# Patient Record
Sex: Female | Born: 1995 | Race: Black or African American | Hispanic: No | Marital: Single | Smoking: Former smoker
Health system: Southern US, Community
[De-identification: ages and names within clinical notes are randomized; demographics above are authoritative.]

## PROBLEM LIST (undated history)

## (undated) DIAGNOSIS — F419 Anxiety disorder, unspecified: Secondary | ICD-10-CM

## (undated) DIAGNOSIS — F32A Depression, unspecified: Secondary | ICD-10-CM

## (undated) HISTORY — PX: EYE SURGERY: SHX253

## (undated) HISTORY — DX: Anxiety disorder, unspecified: F41.9

## (undated) HISTORY — DX: Depression, unspecified: F32.A

---
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
## 2016-09-30 DIAGNOSIS — F410 Panic disorder [episodic paroxysmal anxiety] without agoraphobia: Secondary | ICD-10-CM | POA: Insufficient documentation

## 2019-05-24 ENCOUNTER — Encounter (HOSPITAL_COMMUNITY): Payer: Self-pay | Admitting: Emergency Medicine

## 2019-05-24 ENCOUNTER — Other Ambulatory Visit: Payer: Self-pay

## 2019-05-24 ENCOUNTER — Emergency Department (HOSPITAL_COMMUNITY)
Admission: EM | Admit: 2019-05-24 | Discharge: 2019-05-24 | Disposition: A | Discharge status: Home / Self Care | Payer: BC Managed Care – PPO | Attending: Emergency Medicine | Admitting: Emergency Medicine

## 2019-05-24 DIAGNOSIS — J020 Streptococcal pharyngitis: Secondary | ICD-10-CM | POA: Diagnosis not present

## 2019-05-24 DIAGNOSIS — Z20828 Contact with and (suspected) exposure to other viral communicable diseases: Secondary | ICD-10-CM | POA: Insufficient documentation

## 2019-05-24 DIAGNOSIS — J029 Acute pharyngitis, unspecified: Secondary | ICD-10-CM | POA: Diagnosis present

## 2019-05-24 LAB — SARS CORONAVIRUS 2 (TAT 6-24 HRS): SARS Coronavirus 2: NEGATIVE

## 2019-05-24 LAB — GROUP A STREP BY PCR: Group A Strep by PCR: DETECTED — AB

## 2019-05-24 MED ORDER — ACETAMINOPHEN 160 MG/5ML PO SOLN
650.0000 mg | Freq: Once | ORAL | Status: AC
  Administered 2019-05-24: 650 mg via ORAL
  Filled 2019-05-24: qty 20.3

## 2019-05-24 MED ORDER — DEXAMETHASONE SODIUM PHOSPHATE 10 MG/ML IJ SOLN
10.0000 mg | Freq: Once | INTRAMUSCULAR | Status: AC
  Administered 2019-05-24: 12:00:00 10 mg via INTRAMUSCULAR
  Filled 2019-05-24: qty 1

## 2019-05-24 MED ORDER — PENICILLIN G BENZATHINE 1200000 UNIT/2ML IM SUSP
1.2000 10*6.[IU] | Freq: Once | INTRAMUSCULAR | Status: AC
  Administered 2019-05-24: 12:00:00 1.2 10*6.[IU] via INTRAMUSCULAR
  Filled 2019-05-24: qty 2

## 2019-05-24 NOTE — ED Triage Notes (Signed)
Sore throat, dry cough, and body aches onset yesterday; denies COVID exposures.

## 2019-05-24 NOTE — Discharge Instructions (Signed)

## 2019-05-24 NOTE — ED Provider Notes (Signed)
[facility]   CSN: [id number] Arrival date & time: 05/24/19  [id number]     History   Chief Complaint Chief Complaint  Patient presents with  . Sore Throat    HPI Christy Maldonado is a 23 y.o. female otherwise healthy presents emergency department today with chief complaint of sore throat x2 days.  She is also reporting associated dry cough and generalized bodyaches.  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  She has not worked closely with this resident lately.  Took Alka-Seltzer yesterday with symptom improvement. No modifying or radiating factors. She denies fever, chills, congestion, trouble swallowing, voice changes,chest pain, shortness of breath, abdominal pain, nausea, vomiting, urinary symptoms, diarrhea. History provided by patient.    History reviewed. No pertinent past medical history.  There are no active problems to display for this patient.   History reviewed. No pertinent surgical history.   OB History   No obstetric history on file.      Home Medications    Prior to Admission medications   Not on File    Family History No family history on file.  Social History Social History   Tobacco Use  . Smoking status: Not on file  Substance Use Topics  . Alcohol use: Not on file  . Drug use: Not on file     Allergies   Patient has no known allergies.   Review of Systems Review of Systems  Constitutional: Negative for chills and fever.  HENT: Positive for sore throat. Negative for congestion, ear discharge, ear pain, rhinorrhea, sinus pressure, sinus pain, trouble swallowing and voice change.   Eyes: Negative for pain and redness.  Respiratory: Positive for cough. Negative for shortness of breath.   Cardiovascular: Negative for chest pain.  Gastrointestinal: Negative for abdominal pain, diarrhea, nausea and vomiting.   Musculoskeletal: Negative for back pain and neck pain.  Skin: Negative for rash and wound.     Physical Exam Updated Vital Signs BP 129/62 (BP Location: Right Arm)   Pulse (!) 102   Temp 98.8 F (37.1 C) (Oral)   Resp 16   SpO2 100%   Physical Exam Vitals signs and nursing note reviewed.  Constitutional:      Appearance: She is well-developed. She is not ill-appearing or toxic-appearing.  HENT:     Head: Normocephalic and atraumatic.     Comments: No sinus or temporal tenderness.     Right Ear: Tympanic membrane and external ear normal.     Left Ear: Tympanic membrane and external ear normal.     Nose: Nose normal.     Mouth/Throat:     Mouth: Mucous membranes are dry.     Comments: Minor erythema to oropharynx, no edema. White  Exudate on left tonsil, no tonsillar swelling, voice normal, neck supple without lymphadenopathy  Eyes:     General: No scleral icterus.       Right eye: No discharge.        Left eye: No discharge.     Conjunctiva/sclera: Conjunctivae normal.  Neck:     Musculoskeletal: Normal range of motion. No neck rigidity or muscular tenderness.     Vascular: No JVD.  Cardiovascular:     Rate and Rhythm: Normal rate and regular rhythm.     Pulses: Normal pulses.     Heart sounds: Normal heart sounds.  Pulmonary:     Effort: Pulmonary  effort is normal.     Breath sounds: Normal breath sounds.  Abdominal:     General: There is no distension.  Musculoskeletal: Normal range of motion.  Lymphadenopathy:     Cervical: No cervical adenopathy.  Skin:    General: Skin is warm and dry.  Neurological:     Mental Status: She is oriented to person, place, and time.     GCS: GCS eye subscore is 4. GCS verbal subscore is 5. GCS motor subscore is 6.     Comments: Fluent speech, no facial droop.  Psychiatric:        Behavior: Behavior normal.      ED Treatments / Results  Labs (all labs ordered are listed, but only abnormal results are displayed) Labs  Reviewed  GROUP A STREP BY PCR - Abnormal; Notable for the following components:      Result Value   Group A Strep by PCR DETECTED (*)    All other components within normal limits  SARS CORONAVIRUS 2    EKG None  Radiology No results found.  Procedures Procedures (including critical care time)  Medications Ordered in ED Medications  penicillin g benzathine (BICILLIN LA) 1200000 UNIT/2ML injection 1.2 Million Units (has no administration in time range)  dexamethasone (DECADRON) injection 10 mg (has no administration in time range)  acetaminophen (TYLENOL) solution 650 mg (650 mg Oral Given 05/24/19 1132)     Initial Impression / Assessment and Plan / ED Course  I have reviewed the triage vital signs and the nursing notes.  Pertinent labs & imaging results that were available during my care of the patient were reviewed by me and considered in my medical decision making (see chart for details).   23 yo female who presents with sore throat. Still able to tolerate PO/secretions but with worsening pain. Patient is afebrile, non-toxic appearing, sitting comfortably on examination table. Vital signs reviewed and stable Tachycardic to 102 on arrival. During exam I checked pt's vitals and HR is 85. On exam, she has mild posterior oropharynx with tonsillar exudate on right tonsil. Presentation not concerning for PTA or Ludwig's angina, Uvulitis, epiglottitis, peritonsillar abscess, or retropharyngeal abscess.  Strep test is positive. Pt treated with IM penicillin, steroids while in the ED. Pt does not appear dehydrated, but did discuss importance of water rehydration.  Encouraged at home supportive care measures. Patient successfully fluid challenged in the ED without difficulty swallowing.  Strict return precautions given. NAD. VSS. Recommended PCP follow up for re-evaluation. Send out covid test performed, pt aware to self quarantine until she has result.    Christy Maldonado was evaluated in  Emergency Department on 05/24/2019 for the symptoms described in the history of present illness. She was evaluated in the context of the global COVID-19 pandemic, which necessitated consideration that the patient might be at risk for infection with the SARS-CoV-2 virus that causes COVID-19. Institutional protocols and algorithms that pertain to the evaluation of patients at risk for COVID-19 are in a state of rapid change based on information released by regulatory bodies including the CDC and federal and state organizations. These policies and algorithms were followed during the patient's care in the ED.  This note was prepared using Dragon voice recognition software and may include unintentional dictation errors due to the inherent limitations of voice recognition software.    Final Clinical Impressions(s) / ED Diagnoses   Final diagnoses:  Strep pharyngitis    ED Discharge Orders    None  Flint Melter 05/24/19 1156    Virgel Manifold, MD 05/24/19 1339

## 2019-09-27 ENCOUNTER — Other Ambulatory Visit: Payer: Self-pay

## 2019-09-27 ENCOUNTER — Encounter (HOSPITAL_COMMUNITY): Payer: Self-pay | Admitting: Emergency Medicine

## 2019-09-27 ENCOUNTER — Emergency Department (HOSPITAL_COMMUNITY)
Admission: EM | Admit: 2019-09-27 | Discharge: 2019-09-27 | Disposition: A | Discharge status: Home / Self Care | Payer: BC Managed Care – PPO | Attending: Emergency Medicine | Admitting: Emergency Medicine

## 2019-09-27 DIAGNOSIS — Z20828 Contact with and (suspected) exposure to other viral communicable diseases: Secondary | ICD-10-CM | POA: Insufficient documentation

## 2019-09-27 DIAGNOSIS — M791 Myalgia, unspecified site: Secondary | ICD-10-CM | POA: Diagnosis present

## 2019-09-27 DIAGNOSIS — B349 Viral infection, unspecified: Secondary | ICD-10-CM | POA: Diagnosis not present

## 2019-09-27 LAB — SARS CORONAVIRUS 2 (TAT 6-24 HRS): SARS Coronavirus 2: NEGATIVE

## 2019-09-27 NOTE — ED Provider Notes (Signed)
  [facility] DEPT Provider Note   CSN: [id number] Arrival date & time: 09/27/19  1147     History Chief Complaint  Patient presents with  . Generalized Body Aches    Christy Maldonado is a 23 y.o. female.  Pt complains of body aches and feeling like she has a fever.  Pt does not think she has had a covid exposure.  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  Pt reports she was tested last week and was negative for covid  The history is provided by the patient. No language interpreter was used.       History reviewed. No pertinent past medical history.  There are no problems to display for this patient.   History reviewed. No pertinent surgical history.   OB History   No obstetric history on file.     No family history on file.  Social History   Tobacco Use  . Smoking status: Not on file  Substance Use Topics  . Alcohol use: Not on file  . Drug use: Not on file    Home Medications Prior to Admission medications   Not on File    Allergies    Patient has no known allergies.  Review of Systems   Review of Systems  All other systems reviewed and are negative.   Physical Exam Updated Vital Signs BP 136/83   Pulse 87   Temp 98.5 F (36.9 C) (Oral)   Resp 18   LMP 09/24/2019   SpO2 100%   Physical Exam Vitals and nursing note reviewed.  Constitutional:      Appearance: She is well-developed.  HENT:     Head: Normocephalic.     Nose: Nose normal.     Mouth/Throat:     Mouth: Mucous membranes are moist.  Cardiovascular:     Rate and Rhythm: Normal rate.  Pulmonary:     Effort: Pulmonary effort is normal.  Abdominal:     General: There is no distension.  Musculoskeletal:        General: Normal range of motion.     Cervical back: Normal range of motion.  Skin:    General: Skin is warm.  Neurological:     Mental Status: She is alert and oriented to person, place, and time.     ED Results / Procedures / Treatments     Labs (all labs ordered are listed, but only abnormal results are displayed) Labs Reviewed  SARS CORONAVIRUS 2 (TAT 6-24 HRS)    EKG None  Radiology No results found.  Procedures Procedures (including critical care time)  Medications Ordered in ED Medications - No data to display  ED Course  I have reviewed the triage vital signs and the nursing notes.  Pertinent labs & imaging results that were available during my care of the patient were reviewed by me and considered in my medical decision making (see chart for details).    MDM Rules/Calculators/A&P                      MDM  Covid ordered.  Pt given covid work note.  Final Clinical Impression(s) / ED Diagnoses Final diagnoses:  Viral illness    Rx / DC Orders ED Discharge Orders    None    An After Visit Summary was printed and given to the patient.    Fransico Meadow, [place] 09/27/19 1332    Hayden Rasmussen, MD 09/27/19 [phone]

## 2019-09-27 NOTE — Discharge Instructions (Addendum)
Return if any problems.

## 2019-09-27 NOTE — ED Triage Notes (Signed)
Patient c/o body aches since yesterday. Denies other symptoms.

## 2019-12-14 ENCOUNTER — Other Ambulatory Visit: Payer: Self-pay

## 2019-12-14 ENCOUNTER — Emergency Department (HOSPITAL_COMMUNITY)
Admission: EM | Admit: 2019-12-14 | Discharge: 2019-12-14 | Disposition: A | Discharge status: Home / Self Care | Payer: BC Managed Care – PPO | Attending: Emergency Medicine | Admitting: Emergency Medicine

## 2019-12-14 ENCOUNTER — Encounter (HOSPITAL_COMMUNITY): Payer: Self-pay | Admitting: Emergency Medicine

## 2019-12-14 DIAGNOSIS — J069 Acute upper respiratory infection, unspecified: Secondary | ICD-10-CM

## 2019-12-14 DIAGNOSIS — J029 Acute pharyngitis, unspecified: Secondary | ICD-10-CM | POA: Diagnosis present

## 2019-12-14 LAB — GROUP A STREP BY PCR: Group A Strep by PCR: NOT DETECTED

## 2019-12-14 LAB — INFLUENZA PANEL BY PCR (TYPE A & B)
Influenza A By PCR: NEGATIVE
Influenza B By PCR: NEGATIVE

## 2019-12-14 NOTE — Discharge Instructions (Signed)
Thank you for allowing me to care for you today. Please return to the emergency department if you have new or worsening symptoms.   

## 2019-12-14 NOTE — ED Provider Notes (Signed)
[facility] DEPT Provider Note   CSN: [id number] Arrival date & time: 12/14/19  3716     History Chief Complaint  Patient presents with  . Sore Throat  . Headache  . Nasal Congestion    Christy Maldonado is a 24 y.o. female.  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  Patient reports that the symptoms started yesterday.  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  Her last Covid vaccine was more than 7 days ago.  She denies any fever, chills, nausea, vomiting, cough or chest pain.        History reviewed. No pertinent past medical history.  There are no problems to display for this patient.   History reviewed. No pertinent surgical history.   OB History   No obstetric history on file.     No family history on file.  Social History   Tobacco Use  . Smoking status: Not on file  Substance Use Topics  . Alcohol use: Not on file  . Drug use: Not on file    Home Medications Prior to Admission medications   Not on File    Allergies    Patient has no known allergies.  Review of Systems   Review of Systems  Constitutional: Negative for appetite change, chills, fatigue and fever.  HENT: Positive for congestion, rhinorrhea and sore throat. Negative for ear pain, postnasal drip, sinus pressure, sinus pain, sneezing, tinnitus and trouble swallowing.   Eyes: Negative for pain and visual disturbance.  Respiratory: Negative for cough and shortness of breath.   Cardiovascular: Negative for chest pain and palpitations.  Gastrointestinal: Negative for abdominal pain and vomiting.  Genitourinary: Negative for dysuria and hematuria.  Musculoskeletal: Negative for arthralgias and back pain.  Skin: Negative for color change and rash.    Neurological: Negative for seizures and syncope.  All other systems reviewed and are negative.   Physical Exam Updated Vital Signs BP (!) 141/80   Pulse 78   Temp 98.2 F (36.8 C) (Oral)   Resp 17   SpO2 99%   Physical Exam Vitals and nursing note reviewed.  Constitutional:      General: She is not in acute distress.    Appearance: Normal appearance. She is well-developed. She is not ill-appearing, toxic-appearing or diaphoretic.  HENT:     Head: Normocephalic.     Right Ear: Tympanic membrane normal.     Left Ear: Tympanic membrane normal.     Nose: Rhinorrhea present. No congestion.     Mouth/Throat:     Mouth: Mucous membranes are moist.     Pharynx: Oropharynx is clear. Posterior oropharyngeal erythema present.  Eyes:     Conjunctiva/sclera: Conjunctivae normal.  Cardiovascular:     Rate and Rhythm: Normal rate and regular rhythm.  Pulmonary:     Effort: Pulmonary effort is normal. No respiratory distress.     Breath sounds: Normal breath sounds.  Skin:    General: Skin is dry.  Neurological:     Mental Status: She is alert.  Psychiatric:        Mood and Affect: Mood normal.     ED Results / Procedures / Treatments   Labs (all labs ordered are listed, but only abnormal results are displayed) Labs Reviewed  GROUP A STREP  BY PCR  INFLUENZA PANEL BY PCR (TYPE A & B)    EKG None  Radiology No results found.  Procedures Procedures (including critical care time)  Medications Ordered in ED Medications - No data to display  ED Course  I have reviewed the triage vital signs and the nursing notes.  Pertinent labs & imaging results that were available during my care of the patient were reviewed by me and considered in my medical decision making (see chart for details).  Clinical Course as of Dec 14 1238  Tue Dec 14, 2019  1130 Patient presenting with viral URI symptoms which are mild which started yesterday.  Likely viral URI versus seasonal allergies.   She is fully vaccinated against Covid.  No known exposure.  Given the fact that she works at a nursing home I will swab her for flu and strep.   [KM]  1238 Flu and strep negative. Likely viral URI. Given a work note and advised on return precautions.    [KM]    Clinical Course User Index [KM] Jeral Pinch   MDM Rules/Calculators/A&P                      Based on review of vitals, medical screening exam, lab work and/or imaging, there does not appear to be an acute, emergent etiology for the patient's symptoms. Counseled pt on good return precautions and encouraged both PCP and ED follow-up as needed.  Prior to discharge, I also discussed incidental imaging findings with patient in detail and advised appropriate, recommended follow-up in detail.  Clinical Impression: 1. Upper respiratory tract infection, unspecified type     Disposition: Discharge  Prior to providing a prescription for a controlled substance, I independently reviewed the patient's recent prescription history on the [place] Controlled Substance Reporting System. The patient had no recent or regular prescriptions and was deemed appropriate for a brief, less than 3 day prescription of narcotic for acute analgesia.  This note was prepared with assistance of Conservation officer, historic buildings. Occasional wrong-word or sound-a-like substitutions may have occurred due to the inherent limitations of voice recognition software.  Final Clinical Impression(s) / ED Diagnoses Final diagnoses:  Upper respiratory tract infection, unspecified type    Rx / DC Orders ED Discharge Orders    None       Jeral Pinch 12/14/19 1240    Benjiman Core, MD 12/14/19 1459

## 2019-12-14 NOTE — ED Triage Notes (Signed)
Since yesterday pain with swallowing, nasal congestion and head pressure. Second Covid vaccine on 3/8.

## 2020-01-10 ENCOUNTER — Encounter (HOSPITAL_COMMUNITY): Payer: Self-pay | Admitting: Emergency Medicine

## 2020-01-10 ENCOUNTER — Emergency Department (HOSPITAL_COMMUNITY): Payer: BC Managed Care – PPO

## 2020-01-10 ENCOUNTER — Emergency Department (HOSPITAL_COMMUNITY)
Admission: EM | Admit: 2020-01-10 | Discharge: 2020-01-10 | Disposition: A | Discharge status: Home / Self Care | Payer: BC Managed Care – PPO | Attending: Emergency Medicine | Admitting: Emergency Medicine

## 2020-01-10 DIAGNOSIS — Y9301 Activity, walking, marching and hiking: Secondary | ICD-10-CM | POA: Diagnosis not present

## 2020-01-10 DIAGNOSIS — S8992XA Unspecified injury of left lower leg, initial encounter: Secondary | ICD-10-CM | POA: Diagnosis not present

## 2020-01-10 DIAGNOSIS — Y998 Other external cause status: Secondary | ICD-10-CM | POA: Insufficient documentation

## 2020-01-10 DIAGNOSIS — X509XXA Other and unspecified overexertion or strenuous movements or postures, initial encounter: Secondary | ICD-10-CM | POA: Insufficient documentation

## 2020-01-10 DIAGNOSIS — Y92012 Bathroom of single-family (private) house as the place of occurrence of the external cause: Secondary | ICD-10-CM | POA: Diagnosis not present

## 2020-01-10 NOTE — Discharge Instructions (Addendum)
Apply ice Contact a health care provider if you: Continue to have pain in your knee. Get help right away if you: Have swelling or redness of your knee that gets worse or does not get better. Have severe pain in your knee. Have a fever.

## 2020-01-10 NOTE — ED Triage Notes (Signed)
Pt reports came out of bathroom yesterday and "knee dislocated". 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 Has on one now on left knee.

## 2020-01-10 NOTE — ED Provider Notes (Signed)
[facility] DEPT Provider Note   CSN: [id number] Arrival date & time: 01/10/20  1142     History Chief Complaint  Patient presents with  . Knee Pain    Christy Maldonado is a 24 y.o. female who presents emergency department with a chief complaint of left knee pain.  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  Patient states that she was walking out of her bathroom last night when her left kneecap dislocated and then spontaneously reduced.  She has been wearing her [employer] brace since that time.  Patient states that she is used to her kneecaps dislocating but the pain that she is feeling today is fairly severe and seems similar to her previous ACL tear.  Patient states that she didn't even know she tore her ACL and had to have it repaired.  She complains of pain from primarily on the medial side of her left knee.  That is the direction she felt her kneecap dislocate.  She denies inability to ambulate, numbness or tingling of the lower extremities.  HPI     History reviewed. No pertinent past medical history.  There are no problems to display for this patient.   History reviewed. No pertinent surgical history.   OB History   No obstetric history on file.     No family history on file.  Social History   Tobacco Use  . Smoking status: Not on file  Substance Use Topics  . Alcohol use: Not on file  . Drug use: Not on file    Home Medications Prior to Admission medications   Not on File    Allergies    Patient has no known allergies.  Review of Systems   Review of Systems  Musculoskeletal: Positive for arthralgias, gait problem and joint swelling.  Neurological: Negative for weakness and numbness.   Marland Kitchen Physical Exam Updated Vital Signs BP 125/74 (BP Location: Right Arm)   Pulse 76   Temp 98.3 F (36.8 C) (Oral)   Resp 16   Ht 5\' 5"  (1.651 m)   Wt 47.6 kg   LMP  12/28/2019   SpO2 100%   BMI 17.47 kg/m   Physical Exam Vitals and nursing note reviewed.  Constitutional:      Appearance: She is well-developed and underweight. She is not diaphoretic.     Comments: Extremely underweight  HENT:     Head: Normocephalic and atraumatic.  Eyes:     General: No scleral icterus.    Conjunctiva/sclera: Conjunctivae normal.  Cardiovascular:     Rate and Rhythm: Normal rate and regular rhythm.     Heart sounds: Normal heart sounds. No murmur. No friction rub. No gallop.   Pulmonary:     Effort: Pulmonary effort is normal. No respiratory distress.     Breath sounds: Normal breath sounds.  Abdominal:     General: Bowel sounds are normal. There is no distension.     Palpations: Abdomen is soft. There is no mass.     Tenderness: There is no abdominal tenderness. There is no guarding.  Musculoskeletal:     Cervical back: Normal range of motion.     Left knee: Swelling present. No bony tenderness. Decreased range of motion. Tenderness present over the medial joint line.     Comments: BL atrophic quadriceps Laxity of the patella within the quad tendon BL Swelling and tenderness medial to the left knee cap.  Skin:  General: Skin is warm and dry.  Neurological:     Mental Status: She is alert and oriented to person, place, and time.  Psychiatric:        Behavior: Behavior normal.     ED Results / Procedures / Treatments   Labs (all labs ordered are listed, but only abnormal results are displayed) Labs Reviewed - No data to display  EKG None  Radiology No results found.  Procedures Procedures (including critical care time)  Medications Ordered in ED Medications - No data to display  ED Course  I have reviewed the triage vital signs and the nursing notes.  Pertinent labs & imaging results that were available during my care of the patient were reviewed by me and considered in my medical decision making (see chart for details).    MDM  Rules/Calculators/A&P                     Patient X-Ray negative for obvious fracture or dislocation. Pain managed in ED. Pt advised to follow up with orthopedics if symptoms persist for possibility of missed fracture diagnosis.  conservative therapy recommended and discussed. Patient will be dc home & is agreeable with above plan.  Final Clinical Impression(s) / ED Diagnoses Final diagnoses:  None    Rx / DC Orders ED Discharge Orders    None       Arthor Captain, PA-C 01/10/20 1355    Virgina Norfolk, DO 01/10/20 1448

## 2020-01-11 ENCOUNTER — Other Ambulatory Visit: Payer: Self-pay

## 2020-01-11 ENCOUNTER — Emergency Department (HOSPITAL_COMMUNITY)
Admission: EM | Admit: 2020-01-11 | Discharge: 2020-01-11 | Disposition: A | Discharge status: Home / Self Care | Payer: BC Managed Care – PPO | Attending: Emergency Medicine | Admitting: Emergency Medicine

## 2020-01-11 ENCOUNTER — Encounter (HOSPITAL_COMMUNITY): Payer: Self-pay | Admitting: *Deleted

## 2020-01-11 ENCOUNTER — Emergency Department (HOSPITAL_COMMUNITY): Payer: BC Managed Care – PPO

## 2020-01-11 DIAGNOSIS — S8992XA Unspecified injury of left lower leg, initial encounter: Secondary | ICD-10-CM | POA: Diagnosis present

## 2020-01-11 DIAGNOSIS — Z79899 Other long term (current) drug therapy: Secondary | ICD-10-CM | POA: Insufficient documentation

## 2020-01-11 DIAGNOSIS — Y9389 Activity, other specified: Secondary | ICD-10-CM | POA: Diagnosis not present

## 2020-01-11 DIAGNOSIS — Y92410 Unspecified street and highway as the place of occurrence of the external cause: Secondary | ICD-10-CM | POA: Insufficient documentation

## 2020-01-11 DIAGNOSIS — Y999 Unspecified external cause status: Secondary | ICD-10-CM | POA: Insufficient documentation

## 2020-01-11 DIAGNOSIS — S8002XA Contusion of left knee, initial encounter: Secondary | ICD-10-CM | POA: Insufficient documentation

## 2020-01-11 NOTE — Discharge Instructions (Addendum)
Rest.  Elevate your leg this evening.  Ice for 20 minutes every 2 hours while awake.  Take ibuprofen 600 mg every 6 hours as needed for pain.  Follow-up with your orthopedist tomorrow as scheduled.
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
## 2020-01-11 NOTE — ED Notes (Signed)
Opened chart at pts request to provide work note. 

## 2020-04-26 ENCOUNTER — Emergency Department (HOSPITAL_COMMUNITY)
Admission: EM | Admit: 2020-04-26 | Discharge: 2020-04-26 | Disposition: A | Discharge status: Home / Self Care | Payer: BC Managed Care – PPO | Attending: Emergency Medicine | Admitting: Emergency Medicine

## 2020-04-26 ENCOUNTER — Emergency Department (HOSPITAL_COMMUNITY): Payer: BC Managed Care – PPO

## 2020-04-26 ENCOUNTER — Other Ambulatory Visit: Payer: Self-pay

## 2020-04-26 ENCOUNTER — Encounter (HOSPITAL_COMMUNITY): Payer: Self-pay

## 2020-04-26 DIAGNOSIS — S83411A Sprain of medial collateral ligament of right knee, initial encounter: Secondary | ICD-10-CM

## 2020-04-26 DIAGNOSIS — Y9289 Other specified places as the place of occurrence of the external cause: Secondary | ICD-10-CM | POA: Diagnosis not present

## 2020-04-26 DIAGNOSIS — Y999 Unspecified external cause status: Secondary | ICD-10-CM | POA: Insufficient documentation

## 2020-04-26 DIAGNOSIS — Y9389 Activity, other specified: Secondary | ICD-10-CM | POA: Insufficient documentation

## 2020-04-26 DIAGNOSIS — W010XXA Fall on same level from slipping, tripping and stumbling without subsequent striking against object, initial encounter: Secondary | ICD-10-CM | POA: Insufficient documentation

## 2020-04-26 DIAGNOSIS — S8001XA Contusion of right knee, initial encounter: Secondary | ICD-10-CM | POA: Diagnosis not present

## 2020-04-26 DIAGNOSIS — S80911A Unspecified superficial injury of right knee, initial encounter: Secondary | ICD-10-CM | POA: Diagnosis present

## 2020-04-26 NOTE — Discharge Instructions (Signed)
Wear the elastic knee brace as needed.  Take 2 tylenol and 2 ibuprofen every 6 hours for pain.  Ice for at a time and elevate.

## 2020-04-26 NOTE — ED Triage Notes (Signed)
Patient reports falling in shower and injuring right knee Saturday night. Patient reports previous injury (ACL surgery) in 2016.   C/O 8/10 pain and bruise to right knee.    A/Ox4 Ambulatory in triage.

## 2020-04-26 NOTE — ED Provider Notes (Signed)
[facility]-EMERGENCY DEPT Provider Note   CSN: [id number] Arrival date & time: 04/26/20  0017     History Chief Complaint  Patient presents with  . Knee Pain    Christy Maldonado is a 24 y.o. female.  The history is provided by the patient.  Knee Pain Location:  Knee Time since incident:  4 days Injury: yes   Mechanism of injury comment:  Slipped and fell in the bathtub hitting her knee directly on the side of the bathtub Knee location:  R knee Pain details:    Quality:  Aching and throbbing   Radiates to: The pain sometimes shoots down the right leg into the ankle.   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 No pertinent past medical history.  There are no problems to display for this patient.   History reviewed. No pertinent surgical history.   OB History   No obstetric history on file.     No family history on file.  Social History   Tobacco Use  . Smoking status: Never Smoker  . Smokeless tobacco: Never Used  Vaping Use  . Vaping Use: Every day  Substance Use Topics  . Alcohol use: Not on file  . Drug use: Not on file    Home Medications Prior to Admission medications   Medication Sig Start Date End Date Taking? Authorizing Provider  Multiple Vitamin (MULTIVITAMIN WITH MINERALS) TABS  tablet Take 1 tablet by mouth daily.    [provider]    Allergies    Patient has no known allergies.  Review of Systems   Review of Systems  Musculoskeletal: Positive for stiffness.  All other systems reviewed and are negative.   Physical Exam Updated Vital Signs BP (!) 139/67   Pulse 77   Temp 97.9 F (36.6 C)   Resp 19   LMP 04/12/2020   SpO2 100%   Physical Exam Vitals and nursing note reviewed.  Constitutional:      General: She is not in acute distress.    Appearance: Normal appearance. She is normal weight.  HENT:     Head: Normocephalic.  Eyes:     Pupils: Pupils are equal, round, and reactive to light.  Cardiovascular:     Rate and Rhythm: Normal rate.  Pulmonary:     Effort: Pulmonary effort is normal.  Musculoskeletal:        General: Tenderness present.     Right knee: Swelling and ecchymosis present. Normal range of motion. Tenderness present over the medial joint line and MCL. No lateral joint line, LCL or patellar tendon tenderness. No LCL laxity, MCL laxity, ACL laxity or PCL laxity.     Instability Tests: Anterior drawer test negative. Posterior drawer test negative.       Legs:  Skin:  General: Skin is warm and dry.     Capillary Refill: Capillary refill takes less than 2 seconds.  Neurological:     General: No focal deficit present.     Mental Status: She is alert. Mental status is at baseline.  Psychiatric:        Mood and Affect: Mood normal.        Behavior: Behavior normal.        Thought Content: Thought content normal.     ED Results / Procedures / Treatments   Labs (all labs ordered are listed, but only abnormal results are displayed) Labs Reviewed - No data to display  EKG None  Radiology DG Knee Complete 4 Views Right  Result Date: 04/26/2020 CLINICAL DATA:  Fall with knee pain, diffuse RIGHT knee swelling due to fall in the shower. EXAM: RIGHT KNEE - COMPLETE 4+ VIEW COMPARISON:  None FINDINGS: Signs of prior  ACL repair. Subtle irregularity along the lateral femoral condyle. Seen on the AP view and suggested on the lateral view. No signs of additional bone abnormality or dislocation. Mild swelling about the knee without signs of effusion. IMPRESSION: Subtle irregularity along the lateral femoral condyle. This may represent a small osteochondral lesion in this patient with history of ACL repair. Electronically Signed   By: Donzetta Kohut M.D.   On: 04/26/2020 09:05    Procedures Procedures (including critical care time)  Medications Ordered in ED Medications - No data to display  ED Course  I have reviewed the triage vital signs and the nursing notes.  Pertinent labs & imaging results that were available during my care of the patient were reviewed by me and considered in my medical decision making (see chart for details).    MDM Rules/Calculators/A&P                          Patient presenting after an injury to the right knee 4 days ago due to persistent pain.  Patient has mild swelling and has tenderness over the medial malleolus and MCL.  There is no notable excessive joint laxity at this time.  Patient had no other injuries but has had prior surgery on this knee for ACL repair.  Will get plain films.  At this time low suspicion for dislocation or vascular injury.  No sign of infectious etiology.  9:42 AM Imaging with swelling but no bony fracture.  Placed knee sleeve and recommended f/u.  MDM Number of Diagnoses or Management Options   Amount and/or Complexity of Data Reviewed Tests in the radiology section of CPT: reviewed and ordered Independent visualization of images, tracings, or specimens: yes  Risk of Complications, Morbidity, and/or Mortality Presenting problems: low Diagnostic procedures: low Management options: low  Patient Progress Patient progress: stable   Final Clinical Impression(s) / ED Diagnoses Final diagnoses:  Sprain of medial collateral ligament of right  knee, initial encounter  Contusion of right knee, initial encounter    Rx / DC Orders ED Discharge Orders    None       Gwyneth Sprout, MD 04/26/20 [phone]

## 2020-05-23 ENCOUNTER — Emergency Department (HOSPITAL_COMMUNITY)
Admission: EM | Admit: 2020-05-23 | Discharge: 2020-05-23 | Discharge status: Against Medical Advice | Payer: BC Managed Care – PPO

## 2020-05-23 ENCOUNTER — Other Ambulatory Visit: Payer: Self-pay

## 2020-06-27 ENCOUNTER — Ambulatory Visit (HOSPITAL_COMMUNITY)
Admission: EM | Admit: 2020-06-27 | Discharge: 2020-06-27 | Disposition: A | Discharge status: Home / Self Care | Payer: BC Managed Care – PPO | Attending: Emergency Medicine | Admitting: Emergency Medicine

## 2020-06-27 ENCOUNTER — Other Ambulatory Visit: Payer: Self-pay

## 2020-06-27 ENCOUNTER — Encounter (HOSPITAL_COMMUNITY): Payer: Self-pay | Admitting: Emergency Medicine

## 2020-06-27 DIAGNOSIS — R519 Headache, unspecified: Secondary | ICD-10-CM | POA: Diagnosis present

## 2020-06-27 DIAGNOSIS — Z20822 Contact with and (suspected) exposure to covid-19: Secondary | ICD-10-CM | POA: Diagnosis not present

## 2020-06-27 DIAGNOSIS — J069 Acute upper respiratory infection, unspecified: Secondary | ICD-10-CM | POA: Diagnosis not present

## 2020-06-27 LAB — SARS CORONAVIRUS 2 (TAT 6-24 HRS): SARS Coronavirus 2: NEGATIVE

## 2020-06-27 NOTE — ED Triage Notes (Signed)
Pt presents with headache, sinus congestions and sneezing xs 2 days.

## 2020-06-27 NOTE — Discharge Instructions (Addendum)
Your COVID test is pending.  You should self quarantine until the test result is back.    Take Tylenol as needed for fever or discomfort.  Rest and keep yourself hydrated.    Go to the emergency department if you develop acute worsening symptoms.     

## 2020-06-27 NOTE — ED Provider Notes (Signed)
[facility]    CSN: [id number] Arrival date & time: 06/27/20  1194      History   Chief Complaint Chief Complaint  Patient presents with  . Headache  . Nasal Congestion    HPI Christy Maldonado is a 24 y.o. female.   Patient presents with sinus congestion, rhinorrhea, postnasal drip, sinus headache, sneezing, and fatigue since yesterday.  She has attempted treatment at home with DayQuil.  She denies fever, chills, sore throat, cough, shortness of breath, vomiting, diarrhea, or other symptoms.  The history is provided by the patient.    History reviewed. No pertinent past medical history.  There are no problems to display for this patient.   History reviewed. No pertinent surgical history.  OB History   No obstetric history on file.      Home Medications    Prior to Admission medications   Medication Sig Start Date End Date Taking? Authorizing Provider  Multiple Vitamin (MULTIVITAMIN WITH MINERALS) TABS tablet Take 1 tablet by mouth daily.    [provider]    Family History Family History  Problem Relation Age of Onset  . Hypertension Mother   . Diabetes Mother   . Hypertension Father     Social History Social History   Tobacco Use  . Smoking status: Never Smoker  . Smokeless tobacco: Never Used  Vaping Use  . Vaping Use: Every day  Substance Use Topics  . Alcohol use: Not on file  . Drug use: Not on file     Allergies   Patient has no known allergies.   Review of Systems Review of Systems  Constitutional: Positive for fatigue. Negative for chills and fever.  HENT: Positive for congestion, postnasal drip, rhinorrhea and sneezing. Negative for ear pain and sore throat.   Eyes: Negative for pain and visual disturbance.  Respiratory: Negative for cough and shortness of breath.   Cardiovascular: Negative for chest pain and palpitations.  Gastrointestinal: Negative for abdominal pain, diarrhea and vomiting.  Genitourinary:  Negative for dysuria and hematuria.  Musculoskeletal: Negative for arthralgias and back pain.  Skin: Negative for color change and rash.  Neurological: Positive for headaches. Negative for seizures and syncope.  All other systems reviewed and are negative.    Physical Exam Triage Vital Signs ED Triage Vitals  Enc Vitals Group     BP      Pulse      Resp      Temp      Temp src      SpO2      Weight      Height      Head Circumference      Peak Flow      Pain Score      Pain Loc      Pain Edu?      Excl. in GC?    No data found.  Updated Vital Signs BP 118/75 (BP Location: Left Arm)   Pulse 80   Temp 98.2 F (36.8 C) (Oral)   Resp 16   LMP 06/13/2020   SpO2 100%   Visual Acuity Right Eye Distance:   Left Eye Distance:   Bilateral Distance:    Right Eye Near:   Left Eye Near:    Bilateral Near:     Physical Exam Vitals and nursing note reviewed.  Constitutional:      General: She is not in acute distress.    Appearance: She is well-developed.  HENT:  Head: Normocephalic and atraumatic.     Right Ear: Tympanic membrane normal.     Left Ear: Tympanic membrane normal.     Nose: Rhinorrhea present.     Mouth/Throat:     Mouth: Mucous membranes are moist.     Pharynx: Oropharynx is clear.  Eyes:     Conjunctiva/sclera: Conjunctivae normal.  Cardiovascular:     Rate and Rhythm: Normal rate and regular rhythm.     Heart sounds: No murmur heard.   Pulmonary:     Effort: Pulmonary effort is normal. No respiratory distress.     Breath sounds: Normal breath sounds.  Abdominal:     Palpations: Abdomen is soft.     Tenderness: There is no abdominal tenderness. There is no guarding or rebound.  Musculoskeletal:     Cervical back: Neck supple.  Skin:    General: Skin is warm and dry.     Findings: No rash.  Neurological:     General: No focal deficit present.     Mental Status: She is alert and oriented to person, place, and time.     Gait: Gait  normal.  Psychiatric:        Mood and Affect: Mood normal.        Behavior: Behavior normal.      UC Treatments / Results  Labs (all labs ordered are listed, but only abnormal results are displayed) Labs Reviewed  SARS CORONAVIRUS 2 (TAT 6-24 HRS)    EKG   Radiology No results found.  Procedures Procedures (including critical care time)  Medications Ordered in UC Medications - No data to display  Initial Impression / Assessment and Plan / UC Course  I have reviewed the triage vital signs and the nursing notes.  Pertinent labs & imaging results that were available during my care of the patient were reviewed by me and considered in my medical decision making (see chart for details).   Viral URI.  PCR COVID pending.  Instructed patient to self quarantine until the test result is back.  Discussed symptomatic treatment including Tylenol, rest, hydration.  Instructed patient to go to the ED if she has acute worsening symptoms.  Patient agrees to plan of care.    Final Clinical Impressions(s) / UC Diagnoses   Final diagnoses:  Viral upper respiratory tract infection     Discharge Instructions     Your COVID test is pending.  You should self quarantine until the test result is back.    Take Tylenol as needed for fever or discomfort.  Rest and keep yourself hydrated.    Go to the emergency department if you develop acute worsening symptoms.        ED Prescriptions    None     PDMP not reviewed this encounter.   Mickie Bail, NP 06/27/20 1126

## 2020-10-26 ENCOUNTER — Encounter (HOSPITAL_COMMUNITY): Payer: Self-pay

## 2020-10-26 ENCOUNTER — Other Ambulatory Visit: Payer: Self-pay

## 2020-10-26 ENCOUNTER — Ambulatory Visit (HOSPITAL_COMMUNITY)
Admission: EM | Admit: 2020-10-26 | Discharge: 2020-10-26 | Disposition: A | Discharge status: Home / Self Care | Payer: HRSA Program | Attending: Emergency Medicine | Admitting: Emergency Medicine

## 2020-10-26 DIAGNOSIS — Z20822 Contact with and (suspected) exposure to covid-19: Secondary | ICD-10-CM | POA: Insufficient documentation

## 2020-10-26 DIAGNOSIS — J029 Acute pharyngitis, unspecified: Secondary | ICD-10-CM | POA: Insufficient documentation

## 2020-10-26 LAB — SARS CORONAVIRUS 2 (TAT 6-24 HRS): SARS Coronavirus 2: NEGATIVE

## 2020-10-26 NOTE — ED Triage Notes (Signed)
Pt presents with sore throat X 2 days. 

## 2020-10-26 NOTE — Discharge Instructions (Signed)
Self isolate until covid results are back.  We will notify you by phone if it is positive. Your negative results will be sent through your MyChart.    If it is positive you need to isolate from others for a total of 5 days. If no fever for 24 hours without medications, and symptoms improving you may end isolation on day 6, but wear a mask if around any others for an additional 5 days.   Over the counter medications such as mucinex d may be helpful with your symptoms.  If symptoms worsen or do not improve in the next week to return to be seen or to follow up with your PCP.

## 2020-10-26 NOTE — ED Provider Notes (Signed)
[facility]    CSN: [id number] Arrival date & time: 10/26/20  5400      History   Chief Complaint Chief Complaint  Patient presents with  . Sore Throat    HPI Christy Maldonado is a 25 y.o. female.   [name] presents with complaints of sore throat which she noted two days ago. States around 4 days ago she felt like she couldn't breathe, but this resolved after a night and she felt better. Now with pain with swallowing. Mild congestion. No cough. No gi symptoms. No fevers. No headache. No known ill contacts. She is currently out of work. She has been vaccinated for covid-19. Hasn't taken any medications for symptoms.    ROS per HPI, negative if not otherwise mentioned.      History reviewed. No pertinent past medical history.  There are no problems to display for this patient.   History reviewed. No pertinent surgical history.  OB History   No obstetric history on file.      Home Medications    Prior to Admission medications   Medication Sig Start Date End Date Taking? Authorizing Provider  Multiple Vitamin (MULTIVITAMIN WITH MINERALS) TABS tablet Take 1 tablet by mouth daily.    [provider]    Family History Family History  Problem Relation Age of Onset  . Hypertension Mother   . Diabetes Mother   . Hypertension Father     Social History Social History   Tobacco Use  . Smoking status: Never Smoker  . Smokeless tobacco: Never Used  Vaping Use  . Vaping Use: Every day     Allergies   Patient has no known allergies.   Review of Systems Review of Systems   Physical Exam Triage Vital Signs ED Triage Vitals  Enc Vitals Group     BP 10/26/20 0831 135/88     Pulse Rate 10/26/20 0831 84     Resp 10/26/20 0831 18     Temp 10/26/20 0831 98.1 F (36.7 C)     Temp Source 10/26/20 0831 Oral     SpO2 10/26/20 0831 99 %     Weight --      Height --      Head Circumference --      Peak Flow --      Pain  Score 10/26/20 0832 7     Pain Loc --      Pain Edu? --      Excl. in GC? --    No data found.  Updated Vital Signs BP 135/88 (BP Location: Right Arm)   Pulse 84   Temp 98.1 F (36.7 C) (Oral)   Resp 18   LMP 10/26/2020   SpO2 99%   Visual Acuity Right Eye Distance:   Left Eye Distance:   Bilateral Distance:    Right Eye Near:   Left Eye Near:    Bilateral Near:     Physical Exam Constitutional:      General: She is not in acute distress.    Appearance: She is well-developed.  HENT:     Mouth/Throat:     Mouth: Mucous membranes are moist.     Tonsils: No tonsillar exudate. 1+ on the right. 1+ on the left.  Cardiovascular:     Rate and Rhythm: Normal rate.  Pulmonary:     Effort: Pulmonary effort is normal.  Lymphadenopathy:     Cervical: No cervical adenopathy.  Skin:    General: Skin  is warm and dry.  Neurological:     Mental Status: She is alert and oriented to person, place, and time.      UC Treatments / Results  Labs (all labs ordered are listed, but only abnormal results are displayed) Labs Reviewed  SARS CORONAVIRUS 2 (TAT 6-24 HRS)    EKG   Radiology No results found.  Procedures Procedures (including critical care time)  Medications Ordered in UC Medications - No data to display  Initial Impression / Assessment and Plan / UC Course  I have reviewed the triage vital signs and the nursing notes.  Pertinent labs & imaging results that were available during my care of the patient were reviewed by me and considered in my medical decision making (see chart for details).     Non toxic. Benign physical exam.  History and physical consistent with viral illness.  Covid testing pending and isolation instructions provided.  Supportive cares recommended. Return precautions provided. Patient verbalized understanding and agreeable to plan.   Final Clinical Impressions(s) / UC Diagnoses   Final diagnoses:  Pharyngitis, unspecified etiology      Discharge Instructions     Self isolate until covid results are back.  We will notify you by phone if it is positive. Your negative results will be sent through your MyChart.    If it is positive you need to isolate from others for a total of 5 days. If no fever for 24 hours without medications, and symptoms improving you may end isolation on day 6, but wear a mask if around any others for an additional 5 days.   Over the counter medications such as mucinex d may be helpful with your symptoms.  If symptoms worsen or do not improve in the next week to return to be seen or to follow up with your PCP.     ED Prescriptions    None     PDMP not reviewed this encounter.   Georgetta Haber, NP 10/26/20 [phone]

## 2020-10-28 ENCOUNTER — Telehealth: Payer: Self-pay | Admitting: Physician Assistant

## 2020-10-28 ENCOUNTER — Encounter: Payer: Self-pay | Admitting: Physician Assistant

## 2020-10-28 DIAGNOSIS — F411 Generalized anxiety disorder: Secondary | ICD-10-CM

## 2020-10-28 MED ORDER — HYDROXYZINE HCL 25 MG PO TABS: 25.0000 mg | ORAL_TABLET | Freq: Three times a day (TID) | ORAL | 0 refills | Status: DC | PRN

## 2020-10-28 NOTE — Progress Notes (Signed)
I connected with  Christy Maldonado on 10/28/20 by a video enabled telemedicine application and verified that I am speaking with the correct person using two identifiers.   I discussed the limitations of evaluation and management by telemedicine. The patient expressed understanding and agreed to proceed.    New Patient Office Visit  Subjective:  Patient ID: Christy Maldonado, female    DOB: January 08, 1996  Age: 25 y.o. MRN: [id number]  CC:  Chief Complaint  Patient presents with  . Panic Attack   Virtual Visit via Video Note  I connected with Christy Maldonado on 10/28/20 at  6:00 PM EST by a video enabled telemedicine application and verified that I am speaking with the correct person using two identifiers.  Location: Patient: Home Provider: Working remotely from home   I discussed the limitations of evaluation and management by telemedicine and the availability of in person appointments. The patient expressed understanding and agreed to proceed.  History of Present Illness:   Christy Maldonado reports that she had a panic attack earlier today, states that she was having heart racing, difficulty breathing, states that she called 911, states that they came out and checked her vitals and gave her reassurance that everything was within normal limits.  States that she took a hot shower, and laid down for a nap.  Reports that she did feel better, but states that her anxiety has returned and she is sweating.  Denies fever  Reports history of anxiety, but states that she has not had an anxiety attack in "a long time and never one like this"    Observations/Objective: Medical history and current medications reviewed, no physical exam completed   History reviewed. No pertinent past medical history.  History reviewed. No pertinent surgical history.  Family History  Problem Relation Age of Onset  . Hypertension Mother   . Diabetes Mother   . Hypertension Father     Social  History   Socioeconomic History  . Marital status: Single    Spouse name: Not on file  . Number of children: Not on file  . Years of education: Not on file  . Highest education level: Not on file  Occupational History  . Not on file  Tobacco Use  . Smoking status: Never Smoker  . Smokeless tobacco: Never Used  Vaping Use  . Vaping Use: Every day  Substance and Sexual Activity  . Alcohol use: Not on file  . Drug use: Not on file  . Sexual activity: Not on file  Other Topics Concern  . Not on file  Social History Narrative  . Not on file   Social Determinants of Health   Financial Resource Strain: Not on file  Food Insecurity: Not on file  Transportation Needs: Not on file  Physical Activity: Not on file  Stress: Not on file  Social Connections: Not on file  Intimate Partner Violence: Not on file    ROS Review of Systems  Constitutional: Negative for chills and fever.  HENT: Negative.   Eyes: Negative.   Respiratory: Positive for shortness of breath.   Cardiovascular: Negative for chest pain and palpitations.  Gastrointestinal: Negative.   Endocrine: Negative.   Genitourinary: Negative.   Musculoskeletal: Negative.   Skin: Negative.   Allergic/Immunologic: Negative.   Neurological: Negative.   Hematological: Negative.   Psychiatric/Behavioral: Negative for self-injury, sleep disturbance and suicidal ideas. The patient is nervous/anxious.     Objective:   Today's Vitals: LMP 10/26/2020  Assessment & Plan:   Problem List Items Addressed This Visit   None   Visit Diagnoses    Anxiety state    -  Primary   Relevant Medications   hydrOXYzine (ATARAX/VISTARIL) 25 MG tablet      Outpatient Encounter Medications as of 10/28/2020  Medication Sig  . hydrOXYzine (ATARAX/VISTARIL) 25 MG tablet Take 1 tablet (25 mg total) by mouth 3 (three) times daily as needed.  . Multiple Vitamin (MULTIVITAMIN WITH MINERALS) TABS tablet Take 1 tablet by mouth daily.    No facility-administered encounter medications on file as of 10/28/2020.   Assessment and Plan: 1. Anxiety state Trial hydroxyzine, patient education given on coping skills, patient to follow-up with mobile medicine unit in 3 to 5 days for further review.  Red flags given for prompt reevaluation - hydrOXYzine (ATARAX/VISTARIL) 25 MG tablet; Take 1 tablet (25 mg total) by mouth 3 (three) times daily as needed.  Dispense: 30 tablet; Refill: 0    Follow Up Instructions:    I discussed the assessment and treatment plan with the patient. The patient was provided an opportunity to ask questions and all were answered. The patient agreed with the plan and demonstrated an understanding of the instructions.   The patient was advised to call back or seek an in-person evaluation if the symptoms worsen or if the condition fails to improve as anticipated.   Follow-up: Return in about 1 week (around 11/04/2020).   Kasandra Knudsen [place], PA-C

## 2020-10-28 NOTE — Patient Instructions (Signed)
You will use hydroxyzine 25 mg every 8 hours as needed.  If it makes you too drowsy, you can break the tablet in half and take half of the dose.  My staff will reach out to you on Monday to schedule a follow-up visit for further review.  I hope that you feel better soon, please let me know if you have any other questions.  Roney Jaffe, PA-C Physician Assistant [facility] [email or web address]  Panic Attack A panic attack is a sudden episode of severe anxiety, fear, or discomfort that causes physical and emotional symptoms. The attack may be in response to something frightening, or it may occur for no known reason. Symptoms of a panic attack can be similar to symptoms of a heart attack or stroke. It is important to see your health care provider when you have a panic attack so that these conditions can be ruled out. A panic attack is a symptom of another condition. Most panic attacks go away with treatment of the underlying problem. If you have panic attacks often, you may have a condition called panic disorder. What are the causes? A panic attack may be caused by:  An extreme, life-threatening situation, such as a war or natural disaster.  An anxiety disorder, such as post-traumatic stress disorder.  Depression.  Certain medical conditions, including heart problems, neurological conditions, and infections.  Certain over-the-counter and prescription medicines.  Illegal drugs that increase heart rate and blood pressure, such as methamphetamine.  Alcohol.  Supplements that increase anxiety.  Panic disorder. What increases the risk? You are more likely to develop this condition if:  You have an anxiety disorder.  You have another mental health condition.  You take certain medicines.  You use alcohol, illegal drugs, or other substances.  You are  under extreme stress.  A life event is causing increased feelings of anxiety and depression. What are the signs or symptoms? A panic attack starts suddenly, usually lasts about 20 minutes, and occurs with one or more of the following:  A pounding heart.  A feeling that your heart is beating irregularly or faster than normal (palpitations).  Sweating.  Trembling or shaking.  Shortness of breath or feeling smothered.  Feeling choked.  Chest pain or discomfort.  Nausea or a strange feeling in your stomach.  Dizziness, feeling lightheaded, or feeling like you might faint.  Chills or hot flashes.  Numbness or tingling in your lips, hands, or feet.  Feeling confused, or feeling that you are not yourself.  Fear of losing control or being emotionally unstable.  Fear of dying. How is this diagnosed? A panic attack is diagnosed with an assessment by your health care provider. During the assessment your health care provider will ask questions about:  Your history of anxiety, depression, and panic attacks.  Your medical history.  Whether you drink alcohol, use illegal drugs, take supplements, or take medicines. Be honest about your substance use. Your health care provider may also:  Order blood tests or other kinds of tests to rule out serious medical conditions.  Refer you to a mental health professional for further evaluation.   How is this treated? Treatment depends on the cause of the panic attack:  If the cause is a medical problem, your health care provider will either treat that problem or refer you to a specialist.  If the cause is emotional, you may be given anti-anxiety medicines or referred to a counselor. These medicines may reduce  how often attacks happen, reduce how severe the attacks are, and lower anxiety.  If the cause is a medicine, your health care provider may tell you to stop the medicine, change your dose, or take a different medicine.  If the cause is  a drug, treatment may involve letting the drug wear off and taking medicine to help the drug leave your body or to counteract its effects. Attacks caused by drug abuse may continue even if you stop using the drug. Follow these instructions at home:  Take over-the-counter and prescription medicines only as told by your health care provider.  If you feel anxious, limit your caffeine intake.  Take good care of your physical and mental health by: ? Eating a balanced diet that includes plenty of fresh fruits and vegetables, whole grains, lean meats, and low-fat dairy. ? Getting plenty of rest. Try to get 7-8 hours of uninterrupted sleep each night. ? Exercising regularly. Try to get 30 minutes of physical activity at least 5 days a week. ? Not smoking. Talk to your health care provider if you need help quitting. ? Limiting alcohol intake to no more than 1 drink a day for nonpregnant women and 2 drinks a day for men. One drink equals 12 oz of beer, 5 oz of wine, or 1 oz of hard liquor.  Keep all follow-up visits as told by your health care provider. This is important. Panic attacks may have underlying physical or emotional problems that take time to accurately diagnose. Contact a health care provider if:  Your symptoms do not improve, or they get worse.  You are not able to take your medicine as prescribed because of side effects. Get help right away if:  You have serious thoughts about hurting yourself or others.  You have symptoms of a panic attack. Do not drive yourself to the hospital. Have someone else drive you or call an ambulance. If you ever feel like you may hurt yourself or others, or you have thoughts about taking your own life, get help right away. You can go to your nearest emergency department or call:  Your local emergency services (911 in the [place]  A suicide crisis helpline, such as the National Suicide Prevention Lifeline at 561-436-3253. This is open 24 hours a  day. Summary  A panic attack is a sign of a serious health or mental health condition. Get help right away. Do not drive yourself to the hospital. Have someone else drive you or call an ambulance.  Always see a health care provider to have the reasons for the panic attack correctly diagnosed.  If your panic attack was caused by a physical problem, follow your health care provider's suggestions for medicine, referral to a specialist, and lifestyle changes.  If your panic attack was caused by an emotional problem, follow through with counseling from a qualified mental health specialist.  If you feel like you may hurt yourself or others, call 911 and get help right away. This information is not intended to replace advice given to you by your health care provider. Make sure you discuss any questions you have with your health care provider. Document Revised: 03/16/2020 Document Reviewed: 03/16/2020 Elsevier Patient Education  2021 [employer].

## 2020-10-30 ENCOUNTER — Encounter: Payer: Self-pay | Admitting: Physician Assistant

## 2020-10-30 NOTE — Telephone Encounter (Signed)
Pt contacted and scheduled to see provider for 10/31/20

## 2020-10-31 ENCOUNTER — Encounter: Payer: Self-pay | Admitting: Physician Assistant

## 2020-10-31 ENCOUNTER — Ambulatory Visit (INDEPENDENT_AMBULATORY_CARE_PROVIDER_SITE_OTHER): Payer: Self-pay | Admitting: Physician Assistant

## 2020-10-31 ENCOUNTER — Other Ambulatory Visit: Payer: Self-pay

## 2020-10-31 VITALS — BP 137/87 | HR 112 | Temp 97.3°F | Resp 18 | Ht 65.0 in | Wt 93.0 lb

## 2020-10-31 DIAGNOSIS — R1013 Epigastric pain: Secondary | ICD-10-CM

## 2020-10-31 DIAGNOSIS — R000 Tachycardia, unspecified: Secondary | ICD-10-CM

## 2020-10-31 DIAGNOSIS — F411 Generalized anxiety disorder: Secondary | ICD-10-CM

## 2020-10-31 DIAGNOSIS — F5104 Psychophysiologic insomnia: Secondary | ICD-10-CM

## 2020-10-31 DIAGNOSIS — K5900 Constipation, unspecified: Secondary | ICD-10-CM

## 2020-10-31 MED ORDER — OMEPRAZOLE 20 MG PO CPDR: 20.0000 mg | DELAYED_RELEASE_CAPSULE | Freq: Every day | ORAL | 3 refills | Status: DC

## 2020-10-31 MED ORDER — SERTRALINE HCL 20 MG/ML PO CONC: 50.0000 mg | Freq: Every day | ORAL | 1 refills | Status: DC

## 2020-10-31 NOTE — Progress Notes (Signed)
New Patient Office Visit  Subjective:  Patient ID: Christy Maldonado, female    DOB: 21-May-1996  Age: 25 y.o. MRN: [id number]  CC:  Chief Complaint  Patient presents with   Anxiety    HPI Brixton Schnapp presents after having a video visit over the weekend due to having a panic attack.  Reports that she did try the hydroxyzine with some relief.  Reports that 25 mg does make her very sleepy.  Reports that she took half of 1 the next day, states that her anxiety was much better, took 25 mg at night and was able to sleep well.  Reports that she has been having abnormal bowel movements, denies straining, rectal pain, states she did have a bowel movement yesterday, has been having excessive burping, low appetite, and feels that she is not urinating enough.  Reports that she has been using Pepto without much relief, states she is drinking approximately 3 bottles of water a day.  Reports that she feels tension in her neck, states she feels it is hard to swallow at times, denies food  getting stuck  Does endorse history of anxiety in the past, states that she would always break out in hives if she got nervous.  Reports that she has been having increased life stressors, states that she lost her job and is having difficulty finding one  History reviewed. No pertinent past medical history.  History reviewed. No pertinent surgical history.  Family History  Problem Relation Age of Onset   Hypertension Mother    Diabetes Mother    Hypertension Father     Social History   Socioeconomic History   Marital status: Single    Spouse name: Not on file   Number of children: Not on file   Years of education: Not on file   Highest education level: Not on file  Occupational History   Not on file  Tobacco Use   Smoking status: Never Smoker   Smokeless tobacco: Never Used  Vaping Use   Vaping Use: Every day  Substance and Sexual Activity   Alcohol use: Not  on file   Drug use: Not on file   Sexual activity: Not on file  Other Topics Concern   Not on file  Social History Narrative   Not on file   Social Determinants of Health   Financial Resource Strain: Not on file  Food Insecurity: Not on file  Transportation Needs: Not on file  Physical Activity: Not on file  Stress: Not on file  Social Connections: Not on file  Intimate Partner Violence: Not on file    ROS Review of Systems  Constitutional: Negative for chills, fatigue and fever.  HENT: Negative.   Eyes: Negative.   Respiratory: Negative for shortness of breath and wheezing.   Cardiovascular: Positive for palpitations. Negative for chest pain.  Gastrointestinal: Positive for constipation.  Endocrine: Negative.   Genitourinary: Positive for difficulty urinating.  Musculoskeletal: Negative.   Skin: Negative.   Neurological: Negative for syncope.  Hematological: Negative.   Psychiatric/Behavioral: Negative for self-injury, sleep disturbance and suicidal ideas. The patient is nervous/anxious.     Objective:   Today's Vitals: BP 137/87 (BP Location: Right Arm, Patient Position: Sitting, Cuff Size: Normal)    Pulse (!) 112    Temp (!) 97.3 F (36.3 C) (Oral)    Resp 18    Ht 5\' 5"  (1.651 m)    Wt 93 lb (42.2 kg)    LMP 10/26/2020  SpO2 100%    BMI 15.48 kg/m   Physical Exam Vitals and nursing note reviewed.  Constitutional:      General: She is not in acute distress.    Appearance: Normal appearance. She is not ill-appearing.  HENT:     Head: Normocephalic and atraumatic.     Right Ear: External ear normal.     Left Ear: External ear normal.     Nose: Nose normal.     Mouth/Throat:     Mouth: Mucous membranes are moist.     Pharynx: Oropharynx is clear.  Eyes:     Extraocular Movements: Extraocular movements intact.     Conjunctiva/sclera: Conjunctivae normal.     Pupils: Pupils are equal, round, and reactive to light.  Cardiovascular:     Rate and Rhythm:  Regular rhythm. Tachycardia present.     Heart sounds: Normal heart sounds.  Pulmonary:     Effort: Pulmonary effort is normal.     Breath sounds: Normal breath sounds.  Abdominal:     General: Abdomen is flat.     Palpations: Abdomen is soft.  Musculoskeletal:        General: Normal range of motion.     Cervical back: Normal range of motion and neck supple.  Skin:    General: Skin is warm and dry.  Neurological:     General: No focal deficit present.     Mental Status: She is alert and oriented to person, place, and time.  Psychiatric:        Attention and Perception: Attention normal.        Mood and Affect: Mood is anxious.        Speech: Speech normal.        Behavior: Behavior normal.        Thought Content: Thought content normal.        Cognition and Memory: Cognition normal.        Judgment: Judgment normal.     Assessment & Plan:   Problem List Items Addressed This Visit   None   Visit Diagnoses    Anxiety state    -  Primary   Relevant Medications   sertraline (ZOLOFT) 20 MG/ML concentrated solution   Constipation, unspecified constipation type       Psychophysiological insomnia       Dyspepsia       Relevant Medications   omeprazole (PRILOSEC) 20 MG capsule   Other Relevant Orders   H Pylori, IGM, IGG, IGA AB   Tachycardia       Relevant Orders   Comp. Metabolic Panel (12)   CBC with Differential/Platelet   TSH      Outpatient Encounter Medications as of 10/31/2020  Medication Sig   omeprazole (PRILOSEC) 20 MG capsule Take 1 capsule (20 mg total) by mouth daily.   sertraline (ZOLOFT) 20 MG/ML concentrated solution Take 2.5 mLs (50 mg total) by mouth daily.   hydrOXYzine (ATARAX/VISTARIL) 25 MG tablet Take 1 tablet (25 mg total) by mouth 3 (three) times daily as needed.   Multiple Vitamin (MULTIVITAMIN WITH MINERALS) TABS tablet Take 1 tablet by mouth daily.   No facility-administered encounter medications on file as of 10/31/2020.  1. Anxiety  state Trial Zoloft, continue hydroxyzine as needed.  Patient given an appointment to establish care and follow-up for anxiety - sertraline (ZOLOFT) 20 MG/ML concentrated solution; Take 2.5 mLs (50 mg total) by mouth daily.  Dispense: 60 mL; Refill: 1  2. Constipation, unspecified constipation type  Encourage patient to increase water intake, work on increasing nutrition  3. Psychophysiological insomnia   4. Dyspepsia Trial Prilosec 20 mg once daily - H Pylori, IGM, IGG, IGA AB - omeprazole (PRILOSEC) 20 MG capsule; Take 1 capsule (20 mg total) by mouth daily.  Dispense: 30 capsule; Refill: 3  5. Tachycardia  - Comp. Metabolic Panel (12) - CBC with Differential/Platelet - TSH   I have reviewed the patient's medical history (PMH, PSH, Social History, Family History, Medications, and allergies) , and have been updated if relevant. I spent 30 minutes reviewing chart and  face to face time with patient.     Follow-up: No follow-ups on file.   Kasandra Knudsen [place], PA-C

## 2020-10-31 NOTE — Patient Instructions (Addendum)
You will start Zoloft 50 mg once a day.  If it makes you sleepy, you can take it at bedtime.  Continue using the hydroxyzine, 1/2-1 full tablet as needed to help you with anxiety and insomnia.  You will use omeprazole to help you with your stomach, use this for at least the next 2 weeks.  Work on increasing your hydration to 6 bottles of water a day, work on Insurance claims handler.   I will start a referral for you to be seen for counseling, you can also find lots of great resources online to help with anxiety coping skills.  We will call you with your lab results, please let us know if there is anything else we can do for you   Roney Jaffe, PA-C Physician Assistant [facility] [email or web address]   Managing Anxiety, Adult After being diagnosed with an anxiety disorder, you may be relieved to know why you have felt or behaved a certain way. You may also feel overwhelmed about the treatment ahead and what it will mean for your life. With care and support, you can manage this condition and recover from it. How to manage lifestyle changes Managing stress and anxiety Stress is your body's reaction to life changes and events, both good and bad. Most stress will last just a few hours, but stress can be ongoing and can lead to more than just stress. Although stress can play a major role in anxiety, it is not the same as anxiety. Stress is usually caused by something external, such as a deadline, test, or competition. Stress normally passes after the triggering event has ended.  Anxiety is caused by something internal, such as imagining a terrible outcome or worrying that something will go wrong that will devastate you. Anxiety often does not go away even after the triggering event is over, and it can become long-term (chronic) worry. It is important to understand the differences between stress and anxiety and to manage your stress  effectively so that it does not lead to an anxious response. Talk with your health care provider or a counselor to learn more about reducing anxiety and stress. He or she may suggest tension reduction techniques, such as:  Music therapy. This can include creating or listening to music that you enjoy and that inspires you.  Mindfulness-based meditation. This involves being aware of your normal breaths while not trying to control your breathing. It can be done while sitting or walking.  Centering prayer. This involves focusing on a word, phrase, or sacred image that means something to you and brings you peace.  Deep breathing. To do this, expand your stomach and inhale slowly through your nose. Hold your breath for 3-5 seconds. Then exhale slowly, letting your stomach muscles relax.  Self-talk. This involves identifying thought patterns that lead to anxiety reactions and changing those patterns.  Muscle relaxation. This involves tensing muscles and then relaxing them. Choose a tension reduction technique that suits your lifestyle and personality. These techniques take time and practice. Set aside 5-15 minutes a day to do them. Therapists can offer counseling and training in these techniques. The training to help with anxiety may be covered by some insurance plans. Other things you can do to manage stress and anxiety include:  Keeping a stress/anxiety diary. This can help you learn what triggers your reaction and then learn ways to manage your response.  Thinking about how you react to certain situations. You may not be able to control everything,  but you can control your response.  Making time for activities that help you relax and not feeling guilty about spending your time in this way.  Visual imagery and yoga can help you stay calm and relax.   Medicines Medicines can help ease symptoms. Medicines for anxiety include:  Anti-anxiety drugs.  Antidepressants. Medicines are often used as a  primary treatment for anxiety disorder. Medicines will be prescribed by a health care provider. When used together, medicines, psychotherapy, and tension reduction techniques may be the most effective treatment. Relationships Relationships can play a big part in helping you recover. Try to spend more time connecting with trusted friends and family members. Consider going to couples counseling, taking family education classes, or going to family therapy. Therapy can help you and others better understand your condition. How to recognize changes in your anxiety Everyone responds differently to treatment for anxiety. Recovery from anxiety happens when symptoms decrease and stop interfering with your daily activities at home or work. This may mean that you will start to:  Have better concentration and focus. Worry will interfere less in your daily thinking.  Sleep better.  Be less irritable.  Have more energy.  Have improved memory. It is important to recognize when your condition is getting worse. Contact your health care provider if your symptoms interfere with home or work and you feel like your condition is not improving. Follow these instructions at home: Activity  Exercise. Most adults should do the following: ? Exercise for at least 150 minutes each week. The exercise should increase your heart rate and make you sweat (moderate-intensity exercise). ? Strengthening exercises at least twice a week.  Get the right amount and quality of sleep. Most adults need 7-9 hours of sleep each night. Lifestyle  Eat a healthy diet that includes plenty of vegetables, fruits, whole grains, low-fat dairy products, and lean protein. Do not eat a lot of foods that are high in solid fats, added sugars, or salt.  Make choices that simplify your life.  Do not use any products that contain nicotine or tobacco, such as cigarettes, e-cigarettes, and chewing tobacco. If you need help quitting, ask your health  care provider.  Avoid caffeine, alcohol, and certain over-the-counter cold medicines. These may make you feel worse. Ask your pharmacist which medicines to avoid.   General instructions  Take over-the-counter and prescription medicines only as told by your health care provider.  Keep all follow-up visits as told by your health care provider. This is important. Where to find support You can get help and support from these sources:  Self-help groups.  Online and [employer].  A trusted spiritual leader.  Couples counseling.  Family education classes.  Family therapy. Where to find more information You may find that joining a support group helps you deal with your anxiety. The following sources can help you locate counselors or support groups near you:  Mental Health America: www.mentalhealthamerica.net  Anxiety and Depression Association of [place] (ADAA): ProgramCam.de  [employer] on Mental Illness (NAMI): www.nami.org Contact a health care provider if you:  Have a hard time staying focused or finishing daily tasks.  Spend many hours a day feeling worried about everyday life.  Become exhausted by worry.  Start to have headaches, feel tense, or have nausea.  Urinate more than normal.  Have diarrhea. Get help right away if you have:  A racing heart and shortness of breath.  Thoughts of hurting yourself or others. If you ever feel like you  may hurt yourself or others, or have thoughts about taking your own life, get help right away. You can go to your nearest emergency department or call:  Your local emergency services (911 in the [place]  A suicide crisis helpline, such as the National Suicide Prevention Lifeline at (607)435-4189. This is open 24 hours a day. Summary  Taking steps to learn and use tension reduction techniques can help calm you and help prevent triggering an anxiety reaction.  When used together, medicines, psychotherapy, and  tension reduction techniques may be the most effective treatment.  Family, friends, and partners can play a big part in helping you recover from an anxiety disorder. This information is not intended to replace advice given to you by your health care provider. Make sure you discuss any questions you have with your health care provider. Document Revised: 02/16/2019 Document Reviewed: 02/16/2019 Elsevier Patient Education  2021 [employer].

## 2020-11-02 DIAGNOSIS — F411 Generalized anxiety disorder: Secondary | ICD-10-CM | POA: Insufficient documentation

## 2020-11-02 DIAGNOSIS — R000 Tachycardia, unspecified: Secondary | ICD-10-CM | POA: Insufficient documentation

## 2020-11-02 DIAGNOSIS — F5104 Psychophysiologic insomnia: Secondary | ICD-10-CM | POA: Insufficient documentation

## 2020-11-02 DIAGNOSIS — R1013 Epigastric pain: Secondary | ICD-10-CM | POA: Insufficient documentation

## 2020-11-02 DIAGNOSIS — K5900 Constipation, unspecified: Secondary | ICD-10-CM | POA: Insufficient documentation

## 2020-11-02 LAB — COMP. METABOLIC PANEL (12)
AST: 18 IU/L (ref 0–40)
Albumin/Globulin Ratio: 1.7 (ref 1.2–2.2)
Albumin: 4.8 g/dL (ref 3.9–5.0)
Alkaline Phosphatase: 83 IU/L (ref 44–121)
BUN/Creatinine Ratio: 14 (ref 9–23)
BUN: 12 mg/dL (ref 6–20)
Bilirubin Total: 0.4 mg/dL (ref 0.0–1.2)
Calcium: 9.6 mg/dL (ref 8.7–10.2)
Chloride: 104 mmol/L (ref 96–106)
Creatinine, Ser: 0.85 mg/dL (ref 0.57–1.00)
GFR calc Af Amer: 112 mL/min/{1.73_m2} (ref >59)
GFR calc non Af Amer: 97 mL/min/{1.73_m2} (ref >59)
Globulin, Total: 2.9 g/dL (ref 1.5–4.5)
Glucose: 60 mg/dL — ABNORMAL LOW (ref 65–99)
Potassium: 3.7 mmol/L (ref 3.5–5.2)
Sodium: 140 mmol/L (ref 134–144)
Total Protein: 7.7 g/dL (ref 6.0–8.5)

## 2020-11-02 LAB — CBC WITH DIFFERENTIAL/PLATELET

## 2020-11-02 LAB — TSH: TSH: 0.724 u[IU]/mL (ref 0.450–4.500)

## 2020-11-02 LAB — H PYLORI, IGM, IGG, IGA AB
H pylori, IgM Abs: <9 units (ref 0.0–8.9)
H. pylori, IgA Abs: <9 units (ref 0.0–8.9)
H. pylori, IgG AbS: 0.22 Index Value (ref 0.00–0.79)

## 2020-11-07 ENCOUNTER — Encounter: Payer: Self-pay | Admitting: Physician Assistant

## 2020-11-07 NOTE — Telephone Encounter (Signed)
-----   Message from Roney Jaffe, [place] sent at 11/07/2020  2:03 PM EST ----- Please call patient and let her know that her thyroid, kidney and liver function were within normal limits.  Unable to determine if she is anemic, unfortunately the CBC did not get evaluated due to issue at lab.  She was negative for H. pylori.

## 2020-11-29 ENCOUNTER — Other Ambulatory Visit: Payer: Self-pay

## 2020-11-29 ENCOUNTER — Encounter: Payer: Self-pay | Admitting: Family

## 2020-11-29 ENCOUNTER — Ambulatory Visit (INDEPENDENT_AMBULATORY_CARE_PROVIDER_SITE_OTHER): Payer: Self-pay | Admitting: Family

## 2020-11-29 VITALS — BP 129/90 | HR 98 | Ht 65.67 in | Wt 104.2 lb

## 2020-11-29 DIAGNOSIS — R21 Rash and other nonspecific skin eruption: Secondary | ICD-10-CM

## 2020-11-29 DIAGNOSIS — Z30016 Encounter for initial prescription of transdermal patch hormonal contraceptive device: Secondary | ICD-10-CM

## 2020-11-29 DIAGNOSIS — N926 Irregular menstruation, unspecified: Secondary | ICD-10-CM

## 2020-11-29 DIAGNOSIS — R1013 Epigastric pain: Secondary | ICD-10-CM

## 2020-11-29 DIAGNOSIS — Z7689 Persons encountering health services in other specified circumstances: Secondary | ICD-10-CM

## 2020-11-29 DIAGNOSIS — F411 Generalized anxiety disorder: Secondary | ICD-10-CM

## 2020-11-29 MED ORDER — TRIAMCINOLONE ACETONIDE 0.1 % EX CREA: 1.0000 "application " | TOPICAL_CREAM | Freq: Two times a day (BID) | CUTANEOUS | 0 refills | Status: DC

## 2020-11-29 MED ORDER — OMEPRAZOLE 20 MG PO CPDR: 20.0000 mg | DELAYED_RELEASE_CAPSULE | Freq: Every day | ORAL | 0 refills | Status: DC

## 2020-11-29 MED ORDER — HYDROXYZINE HCL 25 MG PO TABS: 25.0000 mg | ORAL_TABLET | Freq: Three times a day (TID) | ORAL | 0 refills | Status: DC | PRN

## 2020-11-29 MED ORDER — CITALOPRAM HYDROBROMIDE 10 MG PO TABS
10.0000 mg | ORAL_TABLET | Freq: Every day | ORAL | 0 refills | Status: DC
Start: 2020-11-29 — End: 2021-01-03

## 2020-11-29 MED ORDER — XULANE 150-35 MCG/24HR TD PTWK: 1.0000 | MEDICATED_PATCH | TRANSDERMAL | 12 refills | Status: DC

## 2020-11-29 NOTE — Patient Instructions (Addendum)
- Return for annual physical examination, labs, and health maintenance. Arrive fasting meaning having had no food and/or nothing to drink for at least 8 hours prior to appointment. - Begin Lexapro for anxiety and depression. Follow-up in 4 weeks. - Continue Hydroxyzine for anxiety and depression. - Continue Omeprazole for acid reflux. Burr Medico for birth control.  - Triamcinolone cream for rash. - Thank you for choosing Primary Care at [facility] for your medical home!    Christy Maldonado was seen by Rema Fendt, NP today.   Christy Maldonado's primary care provider is Rema Fendt, NP.   For the best care possible,  you should try to see Ricky Stabs, NP whenever you come to clinic.   We look forward to seeing you again soon!  If you have any questions about your visit today,  please call us at [phone]  Or feel free to reach your provider via MyChart.    Generalized Anxiety Disorder, Adult Generalized anxiety disorder (GAD) is a mental health condition. Unlike normal worries, anxiety related to GAD is not triggered by a specific event. These worries do not fade or get better with time. GAD interferes with relationships, work, and school. GAD symptoms can vary from mild to severe. People with severe GAD can have intense waves of anxiety with physical symptoms that are similar to panic attacks. What are the causes? The exact cause of GAD is not known, but the following are believed to have an impact:  Differences in natural brain chemicals.  Genes passed down from parents to children.  Differences in the way threats are perceived.  Development during childhood.  Personality. What increases the risk? The following factors may make you more likely to develop this condition:  Being female.  Having a family history of anxiety disorders.  Being very shy.  Experiencing very stressful life events, such as the death of a loved one.  Having a  very stressful family environment. What are the signs or symptoms? People with GAD often worry excessively about many things in their lives, such as their health and family. Symptoms may also include:  Mental and emotional symptoms: ? Worrying excessively about natural disasters. ? Fear of being late. ? Difficulty concentrating. ? Fears that others are judging your performance.  Physical symptoms: ? Fatigue. ? Headaches, muscle tension, muscle twitches, trembling, or feeling shaky. ? Feeling like your heart is pounding or beating very fast. ? Feeling out of breath or like you cannot take a deep breath. ? Having trouble falling asleep or staying asleep, or experiencing restlessness. ? Sweating. ? Nausea, diarrhea, or irritable bowel syndrome (IBS).  Behavioral symptoms: ? Experiencing erratic moods or irritability. ? Avoidance of new situations. ? Avoidance of people. ? Extreme difficulty making decisions. How is this diagnosed? This condition is diagnosed based on your symptoms and medical history. You will also have a physical exam. Your health care provider may perform tests to rule out other possible causes of your symptoms. To be diagnosed with GAD, a person must have anxiety that:  Is out of his or her control.  Affects several different aspects of his or her life, such as work and relationships.  Causes distress that makes him or her unable to take part in normal activities.  Includes at least three symptoms of GAD, such as restlessness, fatigue, trouble concentrating, irritability, muscle tension, or sleep problems. Before your health care provider can confirm a diagnosis of GAD, these symptoms must be present more  days than they are not, and they must last for 6 months or longer. How is this treated? This condition may be treated with:  Medicine. Antidepressant medicine is usually prescribed for long-term daily control. Anti-anxiety medicines may be added in severe  cases, especially when panic attacks occur.  Talk therapy (psychotherapy). Certain types of talk therapy can be helpful in treating GAD by providing support, education, and guidance. Options include: ? Cognitive behavioral therapy (CBT). People learn coping skills and self-calming techniques to ease their physical symptoms. They learn to identify unrealistic thoughts and behaviors and to replace them with more appropriate thoughts and behaviors. ? Acceptance and commitment therapy (ACT). This treatment teaches people how to be mindful as a way to cope with unwanted thoughts and feelings. ? Biofeedback. This process trains you to manage your body's response (physiological response) through breathing techniques and relaxation methods. You will work with a therapist while machines are used to monitor your physical symptoms.  Stress management techniques. These include yoga, meditation, and exercise. A mental health specialist can help determine which treatment is best for you. Some people see improvement with one type of therapy. However, other people require a combination of therapies.   Follow these instructions at home: Lifestyle  Maintain a consistent routine and schedule.  Anticipate stressful situations. Create a plan, and allow extra time to work with your plan.  Practice stress management or self-calming techniques that you have learned from your therapist or your health care provider. General instructions  Take over-the-counter and prescription medicines only as told by your health care provider.  Understand that you are likely to have setbacks. Accept this and be kind to yourself as you persist to take better care of yourself.  Recognize and accept your accomplishments, even if you judge them as small.  Keep all follow-up visits as told by your health care provider. This is important. Contact a health care provider if:  Your symptoms do not get better.  Your symptoms get  worse.  You have signs of depression, such as: ? A persistently sad or irritable mood. ? Loss of enjoyment in activities that used to bring you joy. ? Change in weight or eating. ? Changes in sleeping habits. ? Avoiding friends or family members. ? Loss of energy for normal tasks. ? Feelings of guilt or worthlessness. Get help right away if:  You have serious thoughts about hurting yourself or others. If you ever feel like you may hurt yourself or others, or have thoughts about taking your own life, get help right away. Go to your nearest emergency department or:  Call your local emergency services (911 in the [place]  Call a suicide crisis helpline, such as the National Suicide Prevention Lifeline at (808) 329-8187. This is open 24 hours a day in the [place]  Text the Crisis Text Line at (913)121-6475 (in the [place] Summary  Generalized anxiety disorder (GAD) is a mental health condition that involves worry that is not triggered by a specific event.  People with GAD often worry excessively about many things in their lives, such as their health and family.  GAD may cause symptoms such as restlessness, trouble concentrating, sleep problems, frequent sweating, nausea, diarrhea, headaches, and trembling or muscle twitching.  A mental health specialist can help determine which treatment is best for you. Some people see improvement with one type of therapy. However, other people require a combination of therapies. This information is not intended to replace advice given to you by your health care  provider. Make sure you discuss any questions you have with your health care provider. Document Revised: 07/07/2019 Document Reviewed: 07/07/2019 Elsevier Patient Education  2021 [employer].

## 2020-11-29 NOTE — Progress Notes (Signed)
Subjective:    Christy Maldonado - 25 y.o. female MRN [id number]  Date of birth: 20-Jan-1996  HPI  Christy Maldonado is to establish care. Patient has a PMH significant for anxiety state, constipation, psychophysiological insomnia, dyspepsia, and tachycardia.   Current issues and/or concerns: 1. ANXIETY FOLLOW-UP: Visit 10/31/2020 per PA note: Trial Zoloft, continue hydroxyzine as needed.  Patient given an appointment to establish care and follow-up for anxiety.  11/29/2020: Reports she was diagnosed with anxiety in 2018 while back home in [place]. She moved to [place] in 2020. January 2022 was her first panic attack. States Zoloft makes her feel loopy and has discontinued use. Would like to try a medication replacement for Zoloft. Still taking Hydroxyzine, usually only taking once daily. Recent anxiety and panic attack related to financial concerns, she reports she lost employment around December 2021. Since then she has found a new job. Reports her generalized body will breakout in hives when experiencing high anxiety and stress. Denies thoughts of self-harm, suicidal ideations, and homicidal ideations.   2. DYSPEPSIA FOLLOW-UP: Visit 10/31/2020 per PA note: Trial Prilosec 20 mg once daily. Tested for H. Pylori.   11/29/2020: Prilosec is helping with acid reflux. States that she is happy to have gained 11 pounds since last visit. Feels that Prilosec has been helpful improving her appetite and ability to keep food down. Would like refill.    3. BIRTH CONTROL PATCHES: LMP: 11/26/2020. Was taking Xulane in the past and would like to begin again.  ROS per HPI   Health Maintenance:  Health Maintenance Due  Topic Date Due  . Hepatitis C Screening  Never done  . HPV VACCINES (1 - Risk 3-dose series) Never done  . HIV Screening  Never done  . TETANUS/TDAP  Never done  . PAP-Cervical Cytology Screening  Never done  . PAP SMEAR-Modifier  Never done  .  INFLUENZA VACCINE  Never done  . COVID-19 Vaccine (3 - Booster for Moderna series) 06/04/2020     Past Medical History: Patient Active Problem List   Diagnosis Date Noted  . Anxiety state 11/02/2020  . Constipation 11/02/2020  . Psychophysiological insomnia 11/02/2020  . Dyspepsia 11/02/2020  . Tachycardia 11/02/2020  . Panic attacks 09/30/2016    Social History   reports that she quit smoking about 4 years ago. Her smoking use included cigarettes. She has never used smokeless tobacco. She reports previous alcohol use. She reports previous drug use. Drug: Marijuana.   Family History  family history includes Diabetes in her mother; Hypertension in her father and mother.   Medications: reviewed and updated   Objective:   Physical Exam BP 129/90 (BP Location: Left Arm, Patient Position: Sitting)   Pulse 98   Ht 5' 5.67" (1.668 m)   Wt 104 lb 3.2 oz (47.3 kg)   LMP 11/26/2020   SpO2 98%   BMI 16.99 kg/m  Physical Exam HENT:     Head: Normocephalic and atraumatic.  Eyes:     Extraocular Movements: Extraocular movements intact.     Pupils: Pupils are equal, round, and reactive to light.  Cardiovascular:     Rate and Rhythm: Normal rate and regular rhythm.     Pulses: Normal pulses.  Pulmonary:     Effort: Pulmonary effort is normal.     Breath sounds: Normal breath sounds.  Musculoskeletal:     Cervical back: Normal range of motion and neck supple.  Skin:    Findings: Rash present. Rash is macular.  Comments: Hyperpigmented rash on bilateral cheeks of face and chin, forehead, and bilateral arms  Neurological:     General: No focal deficit present.     Mental Status: She is alert and oriented to person, place, and time.  Psychiatric:        Mood and Affect: Mood normal.        Behavior: Behavior normal.       Assessment & Plan:  1. Encounter to establish care: - Patient presents today to establish care.  - Return for annual physical examination, labs, and  health maintenance. Arrive fasting meaning having had no food and/or nothing to drink for at least 8 hours prior to appointment.  Please take scheduled medications as normal.  2. Anxiety state: -  Patient reports Zoloft makes her feel loopy and has discontinued use. Would like to try a medication replacement for Zoloft.  - Will try course of low-dose Citalopram as prescribed. Follow-up with primary provider within 4 weeks or sooner if needed.                Avoid driving or hazardous activity until you know how this medication will affect you. Your reactions could be impaired. Dizziness or fainting can cause falls, accidents, or severe injuries.  Common side effects include drowsiness, nausea, constipation, loss of appetite, dry mouth, increased sweating.  Call your doctor if you have pounding heartbeats or fluttering in your chest, a light-headed feeling like you may pass out, easy bruising/unusal bleeding, vision change, difficult or painful urination, impotence/sexual problems, liver problems (right-sided upper stomach pain, itching, dark urine, yellowing of skin or eyes/jaundice, low levels of sodium in the body (headache, confusion, slurred speech, severe weakness, vomiting, loss of coordination, feeling unsteady), or manic episodes (racing thoughts, increased energy, decreased need for sleep, risk-taking behavior, being agitated, talkative)  Seek medical attention immediately if you have symptoms of serotonin syndrome such as agitation, hallucinations, fever, sweating, shivering, fast heart rate, muscle stiffness, twitching, loss of coordination, nausea, vomiting, or diarrhea  Report any new or worsening symptoms to your doctor, such as: mood or behavior changes, anxiety, panic attacks, trouble sleeping, or if you feel impulsive, irritable, agitated, hostile, aggressive, restless, hyperactive (mentally or physically), more depressed, or have thoughts about suicide or hurting yourself - Continue  Hydroxyzine as prescribed.  - citalopram (CELEXA) 10 MG tablet; Take 1 tablet (10 mg total) by mouth daily.  Dispense: 30 tablet; Refill: 0 - hydrOXYzine (ATARAX/VISTARIL) 25 MG tablet; Take 1 tablet (25 mg total) by mouth 3 (three) times daily as needed.  Dispense: 30 tablet; Refill: 0  3. Dyspepsia: - Continue Omeprazole as prescribed.  - Follow-up with primary provider as needed.  - omeprazole (PRILOSEC) 20 MG capsule; Take 1 capsule (20 mg total) by mouth daily.  Dispense: 90 capsule; Refill: 0  4. Rash and nonspecific skin eruption: - Triamcinolone as prescribed. - Follow-up with primary provider as needed.  - triamcinolone (KENALOG) 0.1 %; Apply 1 application topically 2 (two) times daily.  Dispense: 80 g; Refill: 0  5. Encounter for initial prescription of transdermal patch hormonal contraceptive device: - LMP: 11/26/2020. Burr Medico as prescribed.  - Follow-up with primary provider as needed. - norelgestromin-ethinyl estradiol Burr Medico) 150-35 MCG/24HR transdermal patch; Place 1 patch onto the skin once a week.  Dispense: 3 patch; Refill: 12   Patient was given clear instructions to go to Emergency Department or return to medical center if symptoms don't improve, worsen, or new problems develop.The patient verbalized understanding.  I discussed the assessment and treatment plan with the patient. The patient was provided an opportunity to ask questions and all were answered. The patient agreed with the plan and demonstrated an understanding of the instructions.   The patient was advised to call back or seek an in-person evaluation if the symptoms worsen or if the condition fails to improve as anticipated.  Ricky Stabs, NP 12/01/2020, 3:30 PM Primary Care at [facility]

## 2020-11-29 NOTE — Progress Notes (Unsigned)
Seen Christy Mayers, PA-c on 10/28/20 and 10/31/20 regarding panic attacks Stopped taking Zoloft, makes her feel loopy Needs refill on omperazole and hydroxyzine XULANE birth control patches

## 2020-12-04 ENCOUNTER — Encounter: Payer: Self-pay | Admitting: Family

## 2020-12-04 NOTE — Addendum Note (Signed)
Addended by: Rema Fendt on: 12/04/2020 11:06 AM   Modules accepted: Orders

## 2020-12-04 NOTE — Telephone Encounter (Signed)
Please call patient with update.   Burr Medico should be started either the first day of the menstrual cycle or the first Sunday after the menstrual cycle and then applied the same day each week (every 7 days).  I would recommend discontinuing Xulane for this week. Monitor to see if menses stops during this week. If it does then begin Xulane on Sunday 12/10/2020 and use a backup method such as a condom for the first 7 days.  I have also placed a referral to Gynecology in the event that menses does not stop by 12/10/2020.  You may cancel this referral if you no longer need it at the time they call you for an appointment.   Report to Emergency Department or Urgent Care if symptoms don't improve, worsen, or new problems develop.

## 2021-01-01 ENCOUNTER — Encounter: Payer: Self-pay | Admitting: Family

## 2021-01-01 NOTE — Telephone Encounter (Signed)
We will obtain urine pregnancy test on next appointment scheduled 01/03/2021.

## 2021-01-03 ENCOUNTER — Other Ambulatory Visit: Payer: Self-pay

## 2021-01-03 ENCOUNTER — Ambulatory Visit (INDEPENDENT_AMBULATORY_CARE_PROVIDER_SITE_OTHER): Payer: Self-pay | Admitting: Family

## 2021-01-03 ENCOUNTER — Encounter: Payer: Self-pay | Admitting: Family

## 2021-01-03 VITALS — BP 112/77 | HR 75 | Ht 65.0 in | Wt 107.4 lb

## 2021-01-03 DIAGNOSIS — Z114 Encounter for screening for human immunodeficiency virus [HIV]: Secondary | ICD-10-CM

## 2021-01-03 DIAGNOSIS — Z124 Encounter for screening for malignant neoplasm of cervix: Secondary | ICD-10-CM

## 2021-01-03 DIAGNOSIS — Z130 Encounter for screening for diseases of the blood and blood-forming organs and certain disorders involving the immune mechanism: Secondary | ICD-10-CM

## 2021-01-03 DIAGNOSIS — F411 Generalized anxiety disorder: Secondary | ICD-10-CM

## 2021-01-03 DIAGNOSIS — Z0000 Encounter for general adult medical examination without abnormal findings: Secondary | ICD-10-CM

## 2021-01-03 DIAGNOSIS — N926 Irregular menstruation, unspecified: Secondary | ICD-10-CM

## 2021-01-03 DIAGNOSIS — Z13220 Encounter for screening for lipoid disorders: Secondary | ICD-10-CM

## 2021-01-03 DIAGNOSIS — Z13228 Encounter for screening for other metabolic disorders: Secondary | ICD-10-CM

## 2021-01-03 DIAGNOSIS — Z1159 Encounter for screening for other viral diseases: Secondary | ICD-10-CM

## 2021-01-03 DIAGNOSIS — Z131 Encounter for screening for diabetes mellitus: Secondary | ICD-10-CM

## 2021-01-03 LAB — POCT URINE PREGNANCY: Preg Test, Ur: NEGATIVE

## 2021-01-03 NOTE — Patient Instructions (Addendum)
Annual physical exam and labs today.  Referral to Psychiatry. [facility] [address] [place], [place] [address] Phone # [phone] [place]  Schedule appointment for counseling therapy with [facility].   Follow-up with primary provider as scheduled.  Preventive Care 5-24 Years Old, Female Preventive care refers to lifestyle choices and visits with your health care provider that can promote health and wellness. This includes:  A yearly physical exam. This is also called an annual wellness visit.  Regular dental and eye exams.  Immunizations.  Screening for certain conditions.  Healthy lifestyle choices, such as: ? Eating a healthy diet. ? Getting regular exercise. ? Not using drugs or products that contain nicotine and tobacco. ? Limiting alcohol use. What can I expect for my preventive care visit? Physical exam Your health care provider may check your:  Height and weight. These may be used to calculate your BMI (body mass index). BMI is a measurement that tells if you are at a healthy weight.  Heart rate and blood pressure.  Body temperature.  Skin for abnormal spots. Counseling Your health care provider may ask you questions about your:  Past medical problems.  Family's medical history.  Alcohol, tobacco, and drug use.  Emotional well-being.  Home life and relationship well-being.  Sexual activity.  Diet, exercise, and sleep habits.  Work and work Statistician.  Access to firearms.  Method of birth control.  Menstrual cycle.  Pregnancy history. What immunizations do I need? Vaccines are usually given at various ages, according to a schedule. Your health care provider will recommend vaccines for you based on your age, medical history, and lifestyle or other factors, such as travel or where you work.   What tests do I need? Blood tests  Lipid and cholesterol levels. These may be checked every 5 years starting  at age 69.  Hepatitis C test.  Hepatitis B test. Screening  Diabetes screening. This is done by checking your blood sugar (glucose) after you have not eaten for a while (fasting).  STD (sexually transmitted disease) testing, if you are at risk.  BRCA-related cancer screening. This may be done if you have a family history of breast, ovarian, tubal, or peritoneal cancers.  Pelvic exam and Pap test. This may be done every 3 years starting at age 55. Starting at age 70, this may be done every 5 years if you have a Pap test in combination with an HPV test. Talk with your health care provider about your test results, treatment options, and if necessary, the need for more tests.   Follow these instructions at home: Eating and drinking  Eat a healthy diet that includes fresh fruits and vegetables, whole grains, lean protein, and low-fat dairy products.  Take vitamin and mineral supplements as recommended by your health care provider.  Do not drink alcohol if: ? Your health care provider tells you not to drink. ? You are pregnant, may be pregnant, or are planning to become pregnant.  If you drink alcohol: ? Limit how much you have to 0-1 drink a day. ? Be aware of how much alcohol is in your drink. In the [place] one drink equals one 12 oz bottle of beer (355 mL), one 5 oz glass of wine (148 mL), or one 1 oz glass of hard liquor (44 mL).   Lifestyle  Take daily care of your teeth and gums. Brush your teeth every morning and night with fluoride toothpaste. Floss one time each  day.  Stay active. Exercise for at least 30 minutes 5 or more days each week.  Do not use any products that contain nicotine or tobacco, such as cigarettes, e-cigarettes, and chewing tobacco. If you need help quitting, ask your health care provider.  Do not use drugs.  If you are sexually active, practice safe sex. Use a condom or other form of protection to prevent STIs (sexually transmitted infections).  If you  do not wish to become pregnant, use a form of birth control. If you plan to become pregnant, see your health care provider for a prepregnancy visit.  Find healthy ways to cope with stress, such as: ? Meditation, yoga, or listening to music. ? Journaling. ? Talking to a trusted person. ? Spending time with friends and family. Safety  Always wear your seat belt while driving or riding in a vehicle.  Do not drive: ? If you have been drinking alcohol. Do not ride with someone who has been drinking. ? When you are tired or distracted. ? While texting.  Wear a helmet and other protective equipment during sports activities.  If you have firearms in your house, make sure you follow all gun safety procedures.  Seek help if you have been physically or sexually abused. What's next?  Go to your health care provider once a year for an annual wellness visit.  Ask your health care provider how often you should have your eyes and teeth checked.  Stay up to date on all vaccines. This information is not intended to replace advice given to you by your health care provider. Make sure you discuss any questions you have with your health care provider. Document Revised: 05/14/2020 Document Reviewed: 05/28/2018 Elsevier Patient Education  2021 [employer].

## 2021-01-03 NOTE — Progress Notes (Signed)
Patient ID: Christy Maldonado, female    DOB: 1996/07/21  MRN: [id number]  CC: Annual Physical Exam  Subjective: Christy Maldonado is a 26 y.o. female who presents for annual physical exam. Her concerns today include:   1. ANXIETY FOLLOW-UP: 11/29/2020: -  Patient reports Zoloft makes her feel loopy and has discontinued use. Would like to try a medication replacement for Zoloft.  - Will try course of low-dose Citalopram as prescribed. Follow-up with primary provider within 4 weeks or sooner if needed.                                                        01/03/2021: Reports no longer taking Citalopram. Within 1 week of beginning medication reports she became more irritable and has not taken since then. She is interested in referral to Psychiatry.  2. MISSED MENSES: Reports menses 8 days late, requesting pregnancy test. Has appointment scheduled with Gynecology on 01/13/2021.   Reports history of benign fibrocystic breast changes since 2017 and maternal/paternal history of breast cancer.   Patient Active Problem List   Diagnosis Date Noted  . Anxiety state 11/02/2020  . Constipation 11/02/2020  . Psychophysiological insomnia 11/02/2020  . Dyspepsia 11/02/2020  . Tachycardia 11/02/2020  . Panic attacks 09/30/2016     Current Outpatient Medications on File Prior to Visit  Medication Sig Dispense Refill  . citalopram (CELEXA) 10 MG tablet Take 1 tablet (10 mg total) by mouth daily. 30 tablet 0  . hydrOXYzine (ATARAX/VISTARIL) 25 MG tablet Take 1 tablet (25 mg total) by mouth 3 (three) times daily as needed. 30 tablet 0  . Multiple Vitamin (MULTIVITAMIN WITH MINERALS) TABS tablet Take 1 tablet by mouth daily.    . norelgestromin-ethinyl estradiol Burr Medico) 150-35 MCG/24HR transdermal patch Place 1 patch onto the skin once a week. 3 patch 12  . omeprazole (PRILOSEC) 20 MG capsule Take 1 capsule (20 mg total) by mouth daily. 90 capsule 0  . triamcinolone (KENALOG) 0.1 % Apply 1  application topically 2 (two) times daily. 80 g 0   No current facility-administered medications on file prior to visit.    No Known Allergies  Social History   Socioeconomic History  . Marital status: Single    Spouse name: Not on file  . Number of children: Not on file  . Years of education: Not on file  . Highest education level: Not on file  Occupational History  . Not on file  Tobacco Use  . Smoking status: Former Smoker    Types: Cigarettes    Quit date: 2018    Years since quitting: 4.2  . Smokeless tobacco: Never Used  Vaping Use  . Vaping Use: Every day  Substance and Sexual Activity  . Alcohol use: Not Currently  . Drug use: Not Currently    Types: Marijuana  . Sexual activity: Yes  Other Topics Concern  . Not on file  Social History Narrative  . Not on file   Social Determinants of Health   Financial Resource Strain: Not on file  Food Insecurity: Not on file  Transportation Needs: Not on file  Physical Activity: Not on file  Stress: Not on file  Social Connections: Not on file  Intimate Partner Violence: Not on file    Family History  Problem Relation Age of Onset  .  Hypertension Mother   . Diabetes Mother   . Hypertension Father     Past Surgical History:  Procedure Laterality Date  . ARTHROSCOPIC REPAIR ACL  2016  . EYE SURGERY N/A    [employer]reesia 11/28/2020    ROS: Review of Systems Negative except as stated above  PHYSICAL EXAM: BP 112/77 (BP Location: Left Arm, Patient Position: Sitting)   Pulse 75   Ht 5\' 5"  (1.651 m)   Wt 107 lb 6.4 oz (48.7 kg)   SpO2 98%   BMI 17.87 kg/m   Wt Readings from Last 3 Encounters:  01/03/21 107 lb 6.4 oz (48.7 kg)  11/29/20 104 lb 3.2 oz (47.3 kg)  10/31/20 93 lb (42.2 kg)    Physical Exam Exam conducted with a chaperone present.  HENT:     Head: Normocephalic and atraumatic.     Right Ear: Tympanic membrane, ear canal and external ear normal.     Left Ear: Tympanic membrane, ear canal and  external ear normal.     Nose: Nose normal.     Mouth/Throat:     Mouth: Mucous membranes are moist.     Pharynx: Oropharynx is clear.  Eyes:     Extraocular Movements: Extraocular movements intact.     Conjunctiva/sclera: Conjunctivae normal.     Pupils: Pupils are equal, round, and reactive to light.  Cardiovascular:     Rate and Rhythm: Normal rate and regular rhythm.     Pulses: Normal pulses.     Heart sounds: Normal heart sounds.  Pulmonary:     Effort: Pulmonary effort is normal.     Breath sounds: Normal breath sounds.  Chest:  Breasts:     Right: Normal.     Left: Normal.      Comments: 12/29/20, CMA present during examination. Abdominal:     General: Abdomen is flat. Bowel sounds are normal.     Palpations: Abdomen is soft.  Genitourinary:    Comments: Patient declined examination.  Musculoskeletal:        General: Normal range of motion.     Cervical back: Normal range of motion and neck supple.  Skin:    General: Skin is warm and dry.     Capillary Refill: Capillary refill takes less than 2 seconds.  Neurological:     General: No focal deficit present.     Mental Status: She is alert and oriented to person, place, and time.  Psychiatric:        Mood and Affect: Mood normal.        Behavior: Behavior normal.     ASSESSMENT AND PLAN: 1. Annual physical exam: - Counseled on 150 minutes of exercise per week as tolerated, healthy eating (including decreased daily intake of saturated fats, cholesterol, added sugars, sodium), STI prevention, and routine healthcare maintenance.  2. Screening for metabolic disorder: - CMP last obtained 10/31/2020.  3. Screening for deficiency anemia: - CBC to screen for anemia. - CBC  4. Diabetes mellitus screening: - Hemoglobin A1c to screen for pre-diabetes/diabetes. - Hemoglobin A1c  5. Screening cholesterol level: - Lipid panel to screen for high cholesterol.  - Lipid panel  6. Need for hepatitis C screening  test: - Hepatitis C antibody to screen for hepatitis C.  - Hepatitis C Antibody  7. Encounter for screening for HIV: - HIV antibody to screen for human immunodeficiency virus.  - HIV antibody (with reflex)  8. Pap smear for cervical cancer screening: - Referral to Gynecology for cervical  cancer screening by PAP smear.  - Ambulatory referral to Gynecology  9. Anxiety state: - Stable. Denies thoughts of self-harm, suicidal ideations, and homicidal ideations. - Discontinue Citalopram related to side effects. - Referral for counseling sessions with [facility]. - Referral to Psychiatry for further evaluation and management.  - Ambulatory referral to Psychiatry  10. Late menses: - Urine pregnancy negative. - POCT urine pregnancy   Patient was given the opportunity to ask questions.  Patient verbalized understanding of the plan and was able to repeat key elements of the plan. Patient was given clear instructions to go to Emergency Department or return to medical center if symptoms don't improve, worsen, or new problems develop.The patient verbalized understanding.   Orders Placed This Encounter  Procedures  . Hepatitis C Antibody  . HIV antibody (with reflex)  . CBC  . Lipid panel  . Hemoglobin A1c  . Ambulatory referral to Gynecology  . Ambulatory referral to Psychiatry  . POCT urine pregnancy     Requested Prescriptions    No prescriptions requested or ordered in this encounter    Return for Follow-Up counseling with Christy Maldonado .  Rema Fendt, NP

## 2021-01-03 NOTE — Progress Notes (Signed)
Physical

## 2021-01-04 ENCOUNTER — Encounter: Payer: Self-pay | Admitting: Family

## 2021-01-04 LAB — CBC
Hematocrit: 40.5 % (ref 34.0–46.6)
Hemoglobin: 12.9 g/dL (ref 11.1–15.9)
MCH: 27.9 pg (ref 26.6–33.0)
MCHC: 31.9 g/dL (ref 31.5–35.7)
MCV: 88 fL (ref 79–97)
Platelets: 200 10*3/uL (ref 150–450)
RBC: 4.63 x10E6/uL (ref 3.77–5.28)
RDW: 13 % (ref 11.7–15.4)
WBC: 8.2 10*3/uL (ref 3.4–10.8)

## 2021-01-04 LAB — HEMOGLOBIN A1C
Est. average glucose Bld gHb Est-mCnc: 105 mg/dL
Hgb A1c MFr Bld: 5.3 % (ref 4.8–5.6)

## 2021-01-04 LAB — LIPID PANEL
Chol/HDL Ratio: 2.5 ratio (ref 0.0–4.4)
Cholesterol, Total: 219 mg/dL — ABNORMAL HIGH (ref 100–199)
HDL: 87 mg/dL (ref >39)
LDL Chol Calc (NIH): 116 mg/dL — ABNORMAL HIGH (ref 0–99)
Triglycerides: 95 mg/dL (ref 0–149)
VLDL Cholesterol Cal: 16 mg/dL (ref 5–40)

## 2021-01-04 LAB — HEPATITIS C ANTIBODY: Hep C Virus Ab: <0.1 s/co ratio (ref 0.0–0.9)

## 2021-01-04 LAB — HIV ANTIBODY (ROUTINE TESTING W REFLEX): HIV Screen 4th Generation wRfx: NONREACTIVE

## 2021-01-04 NOTE — Progress Notes (Signed)
No anemia.   No diabetes.   HIV negative.   Hepatitis C negative.   Cholesterol higher than expected. High cholesterol may increase risk of heart attack and/or stroke. Consider eating more fruits, vegetables, and lean baked meats such as chicken or fish. Moderate intensity exercise at least 150 minutes as tolerated per week may help as well. Patient encouraged to have rechecked in 6 months.

## 2021-01-06 ENCOUNTER — Encounter: Payer: Self-pay | Admitting: Family

## 2021-01-10 ENCOUNTER — Encounter: Payer: Self-pay | Admitting: Obstetrics and Gynecology

## 2021-03-10 ENCOUNTER — Other Ambulatory Visit: Payer: Self-pay | Admitting: Family

## 2021-03-10 DIAGNOSIS — R1013 Epigastric pain: Secondary | ICD-10-CM

## 2021-04-03 ENCOUNTER — Encounter (HOSPITAL_BASED_OUTPATIENT_CLINIC_OR_DEPARTMENT_OTHER): Payer: Self-pay | Admitting: Obstetrics and Gynecology

## 2021-04-03 ENCOUNTER — Emergency Department (HOSPITAL_BASED_OUTPATIENT_CLINIC_OR_DEPARTMENT_OTHER)
Admission: EM | Admit: 2021-04-03 | Discharge: 2021-04-03 | Disposition: A | Discharge status: Home / Self Care | Payer: Self-pay | Attending: Emergency Medicine | Admitting: Emergency Medicine

## 2021-04-03 ENCOUNTER — Other Ambulatory Visit: Payer: Self-pay

## 2021-04-03 ENCOUNTER — Emergency Department (HOSPITAL_BASED_OUTPATIENT_CLINIC_OR_DEPARTMENT_OTHER): Payer: Self-pay | Admitting: Radiology

## 2021-04-03 DIAGNOSIS — R9431 Abnormal electrocardiogram [ECG] [EKG]: Secondary | ICD-10-CM | POA: Insufficient documentation

## 2021-04-03 DIAGNOSIS — Z87891 Personal history of nicotine dependence: Secondary | ICD-10-CM | POA: Insufficient documentation

## 2021-04-03 DIAGNOSIS — R0789 Other chest pain: Secondary | ICD-10-CM | POA: Insufficient documentation

## 2021-04-03 DIAGNOSIS — R000 Tachycardia, unspecified: Secondary | ICD-10-CM | POA: Insufficient documentation

## 2021-04-03 LAB — PREGNANCY, URINE: Preg Test, Ur: NEGATIVE

## 2021-04-03 LAB — BASIC METABOLIC PANEL
Anion gap: 12 (ref 5–15)
BUN: 7 mg/dL (ref 6–20)
CO2: 21 mmol/L — ABNORMAL LOW (ref 22–32)
Calcium: 9.8 mg/dL (ref 8.9–10.3)
Chloride: 106 mmol/L (ref 98–111)
Creatinine, Ser: 0.74 mg/dL (ref 0.44–1.00)
GFR, Estimated: >60 mL/min (ref >60)
Glucose, Bld: 85 mg/dL (ref 70–99)
Potassium: 3.5 mmol/L (ref 3.5–5.1)
Sodium: 139 mmol/L (ref 135–145)

## 2021-04-03 LAB — CBC
HCT: 40.3 % (ref 36.0–46.0)
Hemoglobin: 13 g/dL (ref 12.0–15.0)
MCH: 27.8 pg (ref 26.0–34.0)
MCHC: 32.3 g/dL (ref 30.0–36.0)
MCV: 86.3 fL (ref 80.0–100.0)
Platelets: 223 10*3/uL (ref 150–400)
RBC: 4.67 MIL/uL (ref 3.87–5.11)
RDW: 13.7 % (ref 11.5–15.5)
WBC: 6.9 10*3/uL (ref 4.0–10.5)
nRBC: 0 % (ref 0.0–0.2)

## 2021-04-03 LAB — TROPONIN I (HIGH SENSITIVITY)
Troponin I (High Sensitivity): <2 ng/L (ref <18)
Troponin I (High Sensitivity): <2 ng/L (ref <18)

## 2021-04-03 NOTE — ED Triage Notes (Signed)
Patient reports for the last 3 days she has been feeling chest discomfort. Patient reports she has severe anxiety and is not sure if its something related to anxiety or cardiac. Patient reports EMS came and did an EKG on her today that showed some T wave abnormalities. Patient states the discomfort/pressure is on the left bra line

## 2021-04-03 NOTE — ED Notes (Signed)
Placed Pt on Bedpan. Pt follws instructions and is doing well.

## 2021-04-04 NOTE — ED Provider Notes (Signed)
[facility] EMERGENCY DEPT Provider Note   CSN: [id number] Arrival date & time: 04/03/21  1412     History No chief complaint on file.   Christy Maldonado is a 25 y.o. female.  25 year old female with history of anxiety who presents with chest discomfort.  Patient reports that for the past 3 days, she has had intermittent episodes of pain in her left breast area that comes and goes randomly, is not associated with exertion, and is nonradiating.  No associated shortness of breath, nausea, vomiting, or diaphoresis.  No pain when she takes a deep breath.  No leg swelling/pain, recent travel, history of blood clots, estrogen use.  She reports history of anxiety and is not sure whether this is related to anxiety.  EMS did an EKG and told her that it was abnormal and she should be evaluated.  She denies any personal history of cardiac disease.  No alcohol or drug problems.  No excessive caffeine use.  The history is provided by the patient.      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     Family History  Problem Relation Age of Onset   Hypertension Mother    Diabetes Mother    Hypertension Father     Social History   Tobacco Use   Smoking status: Former    Pack years: 0.00    Types: Cigarettes    Quit date: 2018    Years since quitting: 4.5   Smokeless tobacco: Never  Vaping Use   Vaping Use: Every day  Substance Use Topics   Alcohol use: Not Currently   Drug use: Not  Currently    Types: Marijuana    Home Medications Prior to Admission medications   Medication Sig Start Date End Date Taking? Authorizing Provider  hydrOXYzine (ATARAX/VISTARIL) 25 MG tablet Take 1 tablet (25 mg total) by mouth 3 (three) times daily as needed. 11/29/20  Yes Zonia Kief, Amy J, NP  omeprazole (PRILOSEC) 20 MG capsule Take 1 capsule by mouth once daily 03/15/21  Yes Zonia Kief, Amy J, NP  triamcinolone (KENALOG) 0.1 % Apply 1 application topically 2 (two) times daily. 11/29/20  Yes Zonia Kief, Amy J, NP  Multiple Vitamin (MULTIVITAMIN WITH MINERALS) TABS tablet Take 1 tablet by mouth daily.    [provider]  norelgestromin-ethinyl estradiol Burr Medico) 150-35 MCG/24HR transdermal patch Place 1 patch onto the skin once a week. 11/29/20   Rema Fendt, NP    Allergies    Patient has no known allergies.  Review of Systems   Review of Systems All other systems reviewed and are negative except that which was mentioned in HPI  Physical Exam Updated Vital Signs BP 128/84   Pulse 83   Temp 97.8 F (36.6 C) (Oral)   Resp 18   LMP 04/01/2021 (Exact Date)   SpO2 100%   Physical Exam Constitutional:      General: She is not in acute distress.  Appearance: Normal appearance.  HENT:     Head: Normocephalic and atraumatic.  Eyes:     Conjunctiva/sclera: Conjunctivae normal.  Cardiovascular:     Rate and Rhythm: Normal rate and regular rhythm.     Heart sounds: Normal heart sounds. No murmur heard. Pulmonary:     Effort: Pulmonary effort is normal.     Breath sounds: Normal breath sounds.  Abdominal:     General: Abdomen is flat. Bowel sounds are normal. There is no distension.     Palpations: Abdomen is soft.     Tenderness: There is no abdominal tenderness.  Musculoskeletal:     Right lower leg: No edema.     Left lower leg: No edema.  Skin:    General: Skin is warm and dry.  Neurological:     Mental Status: She is alert and oriented to person, place, and time.      Comments: fluent  Psychiatric:        Mood and Affect: Mood normal.        Behavior: Behavior normal.    ED Results / Procedures / Treatments   Labs (all labs ordered are listed, but only abnormal results are displayed) Labs Reviewed  BASIC METABOLIC PANEL - Abnormal; Notable for the following components:      Result Value   CO2 21 (*)    All other components within normal limits  CBC  PREGNANCY, URINE  TROPONIN I (HIGH SENSITIVITY)  TROPONIN I (HIGH SENSITIVITY)    EKG EKG Interpretation  Date/Time:  Tuesday April 03 2021 14:27:46 EDT Ventricular Rate:  104 PR Interval:  124 QRS Duration: 82 QT Interval:  342 QTC Calculation: 449 R Axis:   91 Text Interpretation: Sinus tachycardia Right atrial enlargement Rightward axis Pulmonary disease pattern T wave abnormality, consider inferior ischemia Abnormal ECG non-specific T wave inversions, no previous tracing for comparison Confirmed by Frederick Peers [phone]) on 04/03/2021 4:03:43 PM  Radiology DG Chest 2 View  Result Date: 04/03/2021 CLINICAL DATA:  Intermittent chest pain, predominately left-sided EXAM: CHEST - 2 VIEW COMPARISON:  None. FINDINGS: The heart size and mediastinal contours are within normal limits. Both lungs are clear. The visualized skeletal structures are unremarkable. IMPRESSION: No active cardiopulmonary disease. Electronically Signed   By: Acquanetta Belling M.D.   On: 04/03/2021 14:55    Procedures Procedures   Medications Ordered in ED Medications - No data to display  ED Course  I have reviewed the triage vital signs and the nursing notes.  Pertinent labs & imaging results that were available during my care of the patient were reviewed by me and considered in my medical decision making (see chart for details).    MDM Rules/Calculators/A&P                          Well-appearing on exam, reassuring vital signs.  She is PERC negative making PE unlikely.  EKG does have nonspecific T wave inversions, no  previous for comparison.  Chest x-ray is clear, no evidence of pneumonia or pneumothorax.  Lab work is unremarkable including negative serial troponins.  She has no risk factors for early heart disease.  Recommended PCP follow-up to discuss her symptoms further and discussed with her anxiety may be playing a role.  Otherwise, counseled on supportive measures and extensively reviewed return precautions with her.  She voiced understanding. Final Clinical Impression(s) / ED Diagnoses Final diagnoses:  Atypical chest pain  Abnormal EKG  Rx / DC Orders ED Discharge Orders     None        Karyssa Amaral, Ambrose [place], MD 04/04/21 [phone]

## 2021-04-05 NOTE — Progress Notes (Signed)
Virtual Visit via Telephone Note  I connected with Christy Maldonado, on 04/06/2021 at 1:56 PM by telephone due to the COVID-19 pandemic and verified that I am speaking with the correct person using two identifiers.  Due to current restrictions/limitations of in-office visits due to the COVID-19 pandemic, this scheduled clinical appointment was converted to a telehealth visit.   Consent: I discussed the limitations, risks, security and privacy concerns of performing an evaluation and management service by telephone and the availability of in person appointments. I also discussed with the patient that there may be a patient responsible charge related to this service. The patient expressed understanding and agreed to proceed.   Location of Patient: Home  Location of Provider:  Primary Care at [facility]   Persons participating in Telemedicine visit: Eliezer Lofts, NP Margorie John, CMA   History of Present Illness: Christy Maldonado is a 25 year-old female who presents for anxiety follow-up and hospital discharge follow-up.   ANXIETY FOLLOW-UP: 11/29/2020: -  Patient reports Zoloft makes her feel loopy and has discontinued use. Would like to try a medication replacement for Zoloft.  - Will try course of low-dose Citalopram as prescribed. Follow-up with primary provider within 4 weeks or sooner if needed.       - Continue Hydroxyzine as prescribed.   01/03/2021: - Stable. Denies thoughts of self-harm, suicidal ideations, and homicidal ideations. - Discontinue Citalopram related to side effects. - Referral for counseling sessions with [facility]. - Referral to Psychiatry for further evaluation and management.  - Ambulatory referral to Psychiatry   04/06/2021: Reports initially was taking half the prescribed dose of Hydroxyzine. Recently within the last 2 weeks began taking the full dose and doesn't feel that it is helping. Reports  she is ready to proceed with referral to Psychiatry.  2. HOSPITAL DISCHARGE FOLLOW-UP: Visit 04/03/2021 at [facility] per MD note: Well-appearing on exam, reassuring vital signs.  She is PERC negative making PE unlikely.  EKG does have nonspecific T wave inversions, no previous for comparison.  Chest x-ray is clear, no evidence of pneumonia or pneumothorax.  Lab work is unremarkable including negative serial troponins.  She has no risk factors for early heart disease.  Recommended PCP follow-up to discuss her symptoms further and discussed with her anxiety may be playing a role.  Otherwise, counseled on supportive measures and extensively reviewed return precautions with her.  She voiced understanding.  04/06/2021: Stable for now with intermittent chest pain. Concern for abnormal EKG.   Depression screen [facility] 2/9 04/06/2021 01/03/2021 10/31/2020  Decreased Interest 1 0 2  Down, Depressed, Hopeless 1 0 2  PHQ - 2 Score 2 0 4  Altered sleeping 2 0 2  Tired, decreased energy 1 0 2  Change in appetite 0 0 2  Feeling bad or failure about yourself  1 0 2  Trouble concentrating 0 0 2  Moving slowly or fidgety/restless 0 0 0  Suicidal thoughts 0 0 0  PHQ-9 Score 6 0 14  Difficult doing work/chores Somewhat difficult Not difficult at all -      Past Medical History:  Diagnosis Date   Anxiety    [employer] 11/28/2020   Depression    [employer] 11/28/2020   No Known Allergies  Current Outpatient Medications on File Prior to Visit  Medication Sig Dispense Refill   Multiple Vitamin (MULTIVITAMIN WITH MINERALS) TABS tablet Take 1 tablet by mouth daily.  omeprazole (PRILOSEC) 20 MG capsule Take 1 capsule by mouth once daily 90 capsule 0   triamcinolone (KENALOG) 0.1 % Apply 1 application topically 2 (two) times daily. 80 g 0   norelgestromin-ethinyl estradiol [employer]) 150-35 MCG/24HR transdermal patch Place 1 patch onto the skin once a week. (Patient not taking:  Reported on 04/06/2021) 3 patch 12   No current facility-administered medications on file prior to visit.    Observations/Objective: Alert and oriented x 3. Not in acute distress. Physical examination not completed as this is a telemedicine visit.  Assessment and Plan: 1. Anxiety state: - Patient denies thoughts of self-harm, suicidal ideations, and homicidal ideations.  - Increase Hydroxyzine from 25 mg three times daily as needed to 50 mg three times daily as needed.  - Referral to Psychiatry for further evaluation and management.  - Counseled patient to follow-up with me as needed until appointment with Psychiatry. Patient agreeable.  - Ambulatory referral to Psychiatry - hydrOXYzine (ATARAX/VISTARIL) 50 MG tablet; Take 1 tablet (50 mg total) by mouth 3 (three) times daily as needed.  Dispense: 60 tablet; Refill: 0  2. Hospital discharge follow-up: - Reviewed hospital course, current medications, ensured proper follow-up in place, and addressed concerns.   3. Atypical chest pain: 4. Abnormal EKG: - Ongoing intermittent chest pain.  - Recent visit on 04/03/2021 at the Emergency Department for the same. Workup essentially negative. However, EKG on 04/04/2021 significant for T wave abnormality.  - Referral to Cardiology for further evaluation and management. - Ambulatory referral to Cardiology   Follow Up Instructions: Follow-up with primary provider as scheduled. Referral to Psychiatry. Referral to Cardiology.   Patient was given clear instructions to go to Emergency Department or return to medical center if symptoms don't improve, worsen, or new problems develop.The patient verbalized understanding.  I discussed the assessment and treatment plan with the patient. The patient was provided an opportunity to ask questions and all were answered. The patient agreed with the plan and demonstrated an understanding of the instructions.   The patient was advised to call back or seek an  in-person evaluation if the symptoms worsen or if the condition fails to improve as anticipated.    I provided 10 minutes total of non-face-to-face time during this encounter.   Rema Fendt, NP  [facility] Primary Care at [facility] [place], [place] [phone] 04/06/2021, 2:14 PM

## 2021-04-06 ENCOUNTER — Other Ambulatory Visit: Payer: Self-pay

## 2021-04-06 ENCOUNTER — Telehealth (INDEPENDENT_AMBULATORY_CARE_PROVIDER_SITE_OTHER): Payer: Self-pay | Admitting: Family

## 2021-04-06 DIAGNOSIS — Z09 Encounter for follow-up examination after completed treatment for conditions other than malignant neoplasm: Secondary | ICD-10-CM

## 2021-04-06 DIAGNOSIS — F411 Generalized anxiety disorder: Secondary | ICD-10-CM

## 2021-04-06 DIAGNOSIS — R9431 Abnormal electrocardiogram [ECG] [EKG]: Secondary | ICD-10-CM

## 2021-04-06 DIAGNOSIS — R0789 Other chest pain: Secondary | ICD-10-CM

## 2021-04-06 MED ORDER — HYDROXYZINE HCL 50 MG PO TABS: 50.0000 mg | ORAL_TABLET | Freq: Three times a day (TID) | ORAL | 0 refills | Status: AC | PRN

## 2021-04-06 NOTE — Progress Notes (Signed)
Pt presents to follow up from ED visit 04/03/21 for chest pain, pt states she feels fatigue but ok and wants to up dosage on hydroxyzine

## 2021-04-08 ENCOUNTER — Encounter: Payer: Self-pay | Admitting: Family

## 2021-04-09 ENCOUNTER — Other Ambulatory Visit: Payer: Self-pay | Admitting: Family

## 2021-04-09 DIAGNOSIS — F411 Generalized anxiety disorder: Secondary | ICD-10-CM

## 2021-04-27 ENCOUNTER — Emergency Department (HOSPITAL_BASED_OUTPATIENT_CLINIC_OR_DEPARTMENT_OTHER)
Admission: EM | Admit: 2021-04-27 | Discharge: 2021-04-27 | Disposition: A | Discharge status: Home / Self Care | Payer: Self-pay | Attending: Emergency Medicine | Admitting: Emergency Medicine

## 2021-04-27 ENCOUNTER — Other Ambulatory Visit: Payer: Self-pay

## 2021-04-27 ENCOUNTER — Encounter (HOSPITAL_BASED_OUTPATIENT_CLINIC_OR_DEPARTMENT_OTHER): Payer: Self-pay | Admitting: Emergency Medicine

## 2021-04-27 DIAGNOSIS — Z87891 Personal history of nicotine dependence: Secondary | ICD-10-CM | POA: Insufficient documentation

## 2021-04-27 DIAGNOSIS — Z20822 Contact with and (suspected) exposure to covid-19: Secondary | ICD-10-CM | POA: Insufficient documentation

## 2021-04-27 DIAGNOSIS — J069 Acute upper respiratory infection, unspecified: Secondary | ICD-10-CM | POA: Insufficient documentation

## 2021-04-27 LAB — RESP PANEL BY RT-PCR (FLU A&B, COVID) ARPGX2
Influenza A by PCR: NEGATIVE
Influenza B by PCR: NEGATIVE
SARS Coronavirus 2 by RT PCR: NEGATIVE

## 2021-04-27 LAB — GROUP A STREP BY PCR: Group A Strep by PCR: NOT DETECTED

## 2021-04-27 NOTE — ED Triage Notes (Signed)
Reports nasal congestion, sore throat since yesterday at 6pm. Pt  had loose stool once yesterday. Deniesn/v/,.endorses subjective fever.

## 2021-04-27 NOTE — ED Provider Notes (Signed)
[facility] EMERGENCY DEPT Provider Note   CSN: [id number] Arrival date & time: 04/27/21  7711     History Chief Complaint  Patient presents with   Nasal Congestion    Christy Maldonado is a 25 y.o. female.  No sick contacts. 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 Vaccinated x 2 for COVID without a booster.  The history is provided by the patient.  Sore Throat This is a new problem. The current episode started yesterday. The problem occurs constantly. The problem has not changed since onset.Pertinent negatives include no chest pain, no abdominal pain, no headaches and no shortness of breath. Nothing aggravates the symptoms. Nothing relieves the symptoms. She has tried nothing for the symptoms. The treatment provided no relief.      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     Family History  Problem Relation Age of Onset   Hypertension Mother    Diabetes Mother    Hypertension Father     Social History   Tobacco Use   Smoking status: Former    Types: Cigarettes    Quit date: 2018    Years since quitting: 4.5   Smokeless tobacco: Never  Vaping Use   Vaping Use: Every day  Substance Use Topics   Alcohol use: Not Currently   Drug use: Not Currently    Types: Marijuana    Home Medications Prior to Admission medications   Medication Sig Start Date End Date Taking? Authorizing Provider  hydrOXYzine (ATARAX/VISTARIL) 50 MG tablet Take 1 tablet (50 mg total) by  mouth 3 (three) times daily as needed. 04/06/21 05/16/21 Yes Rema Fendt, NP  Multiple Vitamin (MULTIVITAMIN WITH MINERALS) TABS tablet Take 1 tablet by mouth daily.   Yes [provider]  omeprazole (PRILOSEC) 20 MG capsule Take 1 capsule by mouth once daily 03/15/21  Yes Zonia Kief, Amy J, NP  norelgestromin-ethinyl estradiol [employer]) 150-35 MCG/24HR transdermal patch Place 1 patch onto the skin once a week. Patient not taking: Reported on 04/06/2021 11/29/20   Rema Fendt, NP  triamcinolone (KENALOG) 0.1 % Apply 1 application topically 2 (two) times daily. 11/29/20   Rema Fendt, NP    Allergies    Patient has no known allergies.  Review of Systems   Review of Systems  Constitutional:  Negative for chills and fever.  HENT:  Positive for congestion. Negative for ear pain and sore throat.   Eyes:  Negative for pain and visual disturbance.  Respiratory:  Positive for cough. Negative for shortness of breath.   Cardiovascular:  Negative for chest pain and palpitations.  Gastrointestinal:  Negative for abdominal pain and vomiting.  Genitourinary:  Negative for dysuria and hematuria.  Musculoskeletal:  Negative for arthralgias and back pain.  Skin:  Negative for color change and rash.  Neurological:  Negative for seizures, syncope and headaches.  All other systems reviewed and are negative.  Physical Exam Updated Vital Signs BP  125/84   Pulse 92   Temp 98.7 F (37.1 C) (Oral)   Resp 18   LMP 04/01/2021 (Exact Date)   SpO2 100%   Physical Exam Vitals and nursing note reviewed.  HENT:     Head: Normocephalic and atraumatic.     Nose: Nose normal.     Mouth/Throat:     Mouth: Mucous membranes are moist.     Pharynx: No oropharyngeal exudate or posterior oropharyngeal erythema.  Eyes:     General: No scleral icterus.    Conjunctiva/sclera: Conjunctivae normal.  Pulmonary:     Effort: Pulmonary effort is normal. No respiratory distress.  Musculoskeletal:     Cervical  back: Normal range of motion.  Lymphadenopathy:     Cervical: No cervical adenopathy.  Skin:    General: Skin is warm and dry.  Neurological:     General: No focal deficit present.     Mental Status: She is alert and oriented to person, place, and time.  Psychiatric:        Mood and Affect: Mood normal.    ED Results / Procedures / Treatments   Labs (all labs ordered are listed, but only abnormal results are displayed) Labs Reviewed  GROUP A STREP BY PCR  RESP PANEL BY RT-PCR (FLU A&B, COVID) ARPGX2    EKG None  Radiology No results found.  Procedures Procedures   Medications Ordered in ED Medications - No data to display  ED Course  I have reviewed the triage vital signs and the nursing notes.  Pertinent labs & imaging results that were available during my care of the patient were reviewed by me and considered in my medical decision making (see chart for details).    MDM Rules/Calculators/A&P                           Christy Maldonado presents with less than 24 hours of upper respiratory symptoms.  Negative for flu, RSV, COVID, and strep.  I recommended symptomatic management and follow-up if symptoms are persistent. Final Clinical Impression(s) / ED Diagnoses Final diagnoses:  Viral upper respiratory tract infection    Rx / DC Orders ED Discharge Orders     None        Koleen Distance, MD 04/27/21 [phone]

## 2021-04-27 NOTE — ED Notes (Signed)
Pt dc home with belongings. Pt states understanding of dc instructions.

## 2021-05-21 ENCOUNTER — Encounter: Payer: Self-pay | Admitting: Family

## 2021-05-22 ENCOUNTER — Emergency Department (HOSPITAL_BASED_OUTPATIENT_CLINIC_OR_DEPARTMENT_OTHER)
Admission: EM | Admit: 2021-05-22 | Discharge: 2021-05-22 | Disposition: A | Discharge status: Home / Self Care | Payer: Self-pay | Attending: Emergency Medicine | Admitting: Emergency Medicine

## 2021-05-22 ENCOUNTER — Encounter (HOSPITAL_BASED_OUTPATIENT_CLINIC_OR_DEPARTMENT_OTHER): Payer: Self-pay

## 2021-05-22 DIAGNOSIS — R067 Sneezing: Secondary | ICD-10-CM | POA: Insufficient documentation

## 2021-05-22 DIAGNOSIS — R0989 Other specified symptoms and signs involving the circulatory and respiratory systems: Secondary | ICD-10-CM | POA: Insufficient documentation

## 2021-05-22 DIAGNOSIS — R519 Headache, unspecified: Secondary | ICD-10-CM | POA: Insufficient documentation

## 2021-05-22 DIAGNOSIS — Z87891 Personal history of nicotine dependence: Secondary | ICD-10-CM | POA: Insufficient documentation

## 2021-05-22 DIAGNOSIS — Z20822 Contact with and (suspected) exposure to covid-19: Secondary | ICD-10-CM | POA: Insufficient documentation

## 2021-05-22 DIAGNOSIS — R059 Cough, unspecified: Secondary | ICD-10-CM | POA: Insufficient documentation

## 2021-05-22 LAB — RESP PANEL BY RT-PCR (FLU A&B, COVID) ARPGX2
Influenza A by PCR: NEGATIVE
Influenza B by PCR: NEGATIVE
SARS Coronavirus 2 by RT PCR: NEGATIVE

## 2021-05-22 NOTE — ED Triage Notes (Signed)
Pt arrives POV, c/o nasal congestion, sneezing, and generalized body aches starting this morning.  Denies fever, cough, shortness of breath.

## 2021-05-22 NOTE — ED Provider Notes (Signed)
[facility] EMERGENCY DEPT Provider Note   CSN: [id number] Arrival date & time: 05/22/21  1739     History Chief Complaint  Patient presents with   Generalized Body Aches    Christy Maldonado is a 25 y.o. female.  Patient presents chief complaint of cough, sneeze, congestion, headache and body aches.  Symptoms ongoing since yesterday.  Denies fevers at home denies cough.  Denies shortness of breath or chest pain.  Denies vomiting or diarrhea.  Patient is vaccinated for COVID but has not received a booster yet.  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     Family History  Problem Relation Age of Onset   Hypertension Mother    Diabetes Mother    Hypertension Father     Social History   Tobacco Use   Smoking status: Former    Types: Cigarettes    Quit date: 2018    Years since quitting: 4.6   Smokeless tobacco: Never  Vaping Use   Vaping Use: Every day  Substance Use Topics   Alcohol use: Not Currently   Drug use: Not Currently    Types: Marijuana    Home Medications Prior to Admission medications   Medication Sig Start Date End Date Taking? Authorizing Provider  hydrOXYzine (ATARAX/VISTARIL) 50 MG tablet Take 50 mg by mouth 3 (three) times daily as needed.   Yes [provider]  Multiple Vitamin (MULTIVITAMIN WITH MINERALS) TABS tablet Take 1 tablet by mouth  daily.   Yes [provider]  omeprazole (PRILOSEC) 20 MG capsule Take 1 capsule by mouth once daily 03/15/21  Yes Zonia Kief, Amy J, NP  triamcinolone (KENALOG) 0.1 % Apply 1 application topically 2 (two) times daily. 11/29/20  Yes Zonia Kief, Amy J, NP  norelgestromin-ethinyl estradiol [employer]) 150-35 MCG/24HR transdermal patch Place 1 patch onto the skin once a week. Patient not taking: Reported on 04/06/2021 11/29/20   Rema Fendt, NP    Allergies    Patient has no known allergies.  Review of Systems   Review of Systems  Constitutional:  Negative for fever.  HENT:  Negative for ear pain.   Eyes:  Negative for pain.  Respiratory:  Positive for cough.   Cardiovascular:  Negative for chest pain.  Gastrointestinal:  Negative for abdominal pain.  Genitourinary:  Negative for flank pain.  Musculoskeletal:  Negative for back pain.  Skin:  Negative for rash.  Neurological:  Positive for headaches.   Physical Exam Updated Vital Signs BP 136/85 (BP Location: Right Arm)   Pulse 98   Temp 98.6 F (37 C) (Oral)   Resp 12   LMP 04/30/2021   SpO2 100%   Physical Exam Constitutional:      General: She is not in acute distress.    Appearance: Normal appearance.  HENT:  Head: Normocephalic.     Nose: Nose normal.  Eyes:     Extraocular Movements: Extraocular movements intact.  Cardiovascular:     Rate and Rhythm: Normal rate.  Pulmonary:     Effort: Pulmonary effort is normal.  Musculoskeletal:        General: Normal range of motion.     Cervical back: Normal range of motion.  Neurological:     General: No focal deficit present.     Mental Status: She is alert. Mental status is at baseline.    ED Results / Procedures / Treatments   Labs (all labs ordered are listed, but only abnormal results are displayed) Labs Reviewed  RESP PANEL BY RT-PCR (FLU A&B, COVID) ARPGX2    EKG None  Radiology No results found.  Procedures Procedures   Medications Ordered in  ED Medications - No data to display  ED Course  I have reviewed the triage vital signs and the nursing notes.  Pertinent labs & imaging results that were available during my care of the patient were reviewed by me and considered in my medical decision making (see chart for details).    MDM Rules/Calculators/A&P                           Vitals within normal limits.  Patient is otherwise clinically well-appearing nontachypneic nontachycardic.  COVID test is sent, advised to follow-up on my chart application.  Advised isolation until results return negative her symptoms are resolved.  Advised immediate return for difficulty breathing worsening symptoms or any additional concerns.  Final Clinical Impression(s) / ED Diagnoses Final diagnoses:  Suspected COVID-19 virus infection    Rx / DC Orders ED Discharge Orders     None        Cheryll Cockayne, MD 05/22/21 Rickey Primus

## 2021-05-22 NOTE — Discharge Instructions (Addendum)
If you had any testing done today, the results will show up on your MyChart phone app in 24 hours.  Call your primary care doctor in the next 1-2 days to arrange video follow-up.   Use a finger pulse oximeter at home.  You may purchase one at [facility] or [facility] or online.  If the numbers drops and stays below 90%, return immediately back to the ER.  Otherwise increase your fluid intake, isolate at home for 5 days if symptoms have resolved, and inform recent close contacts of the need to test for Covid if they develop symptoms.  

## 2021-05-23 ENCOUNTER — Encounter: Payer: Self-pay | Admitting: Family

## 2021-06-08 ENCOUNTER — Ambulatory Visit: Payer: Self-pay | Admitting: Cardiovascular Disease

## 2021-06-17 ENCOUNTER — Other Ambulatory Visit: Payer: Self-pay | Admitting: Family

## 2021-06-17 DIAGNOSIS — R1013 Epigastric pain: Secondary | ICD-10-CM

## 2021-06-18 ENCOUNTER — Encounter: Payer: Self-pay | Admitting: Family

## 2021-06-18 ENCOUNTER — Other Ambulatory Visit: Payer: Self-pay

## 2021-06-18 DIAGNOSIS — R1013 Epigastric pain: Secondary | ICD-10-CM

## 2021-06-18 MED ORDER — OMEPRAZOLE 20 MG PO CPDR: 20.0000 mg | DELAYED_RELEASE_CAPSULE | Freq: Every day | ORAL | 0 refills | Status: DC

## 2021-06-24 NOTE — Progress Notes (Signed)
Patient ID: Christy Maldonado, female    DOB: 1995/12/16  MRN: [id number]  CC: Contraception Management   Subjective: Christy Maldonado is a 25 y.o. female who presents for contraception management.   Her concerns today include:   Reports would like to begin Depo injection. Also, hoping Depo will help to gain weight. LMP ended today. Reports she is concerned about the cost of the Depo injection and would like more details concerning this prior to administration. No additional issues/concerns.  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     Multiple Vitamin (MULTIVITAMIN WITH MINERALS) TABS tablet Take 1 tablet by mouth daily.     norelgestromin-ethinyl estradiol [employer]) 150-35 MCG/24HR transdermal patch Place 1 patch onto the skin once a week. (Patient not taking: Reported on 04/06/2021) 3 patch 12   omeprazole (PRILOSEC) 20 MG capsule Take 1 capsule (20 mg total) by mouth daily. 90 capsule 0   triamcinolone (KENALOG) 0.1 % Apply 1 application topically 2 (two) times daily. 80 g 0   No current facility-administered medications on file prior to visit.    No Known Allergies  Social History   Socioeconomic History   Marital status: Single    Spouse name: Not on file   Number of children: Not on file   Years of education: Not on file   Highest education level: Not on file  Occupational History   Not on file  Tobacco Use   Smoking status: Former    Types: Cigarettes    Quit date: 2018    Years since quitting: 4.7   Smokeless tobacco: Never  Vaping Use   Vaping Use: Every day  Substance and Sexual  Activity   Alcohol use: Not Currently   Drug use: Not Currently    Types: Marijuana   Sexual activity: Yes  Other Topics Concern   Not on file  Social History Narrative   Not on file   Social Determinants of Health   Financial Resource Strain: Not on file  Food Insecurity: Not on file  Transportation Needs: Not on file  Physical Activity: Not on file  Stress: Not on file  Social Connections: Not on file  Intimate Partner Violence: Not on file    Family History  Problem Relation Age of Onset   Hypertension Mother    Diabetes Mother    Hypertension Father     Past Surgical History:  Procedure Laterality Date   ARTHROSCOPIC REPAIR ACL  2016   EYE SURGERY N/A    [employer]ia 11/28/2020    ROS: Review of Systems Negative except as stated above  PHYSICAL EXAM: BP 110/78 (BP Location: Left Arm, Patient Position: Sitting, Cuff Size: Normal)   Pulse 85   Temp 98.4 F (36.9 C)   Resp 18   Ht 5' 5.67" (1.668 m)   Wt 110 lb 9.6 oz (50.2 kg)   SpO2 99%   BMI 18.03 kg/m   Physical Exam HENT:     Head: Normocephalic and atraumatic.  Eyes:     Extraocular Movements: Extraocular movements intact.     Conjunctiva/sclera: Conjunctivae normal.     Pupils:  Pupils are equal, round, and reactive to light.  Cardiovascular:     Rate and Rhythm: Normal rate and regular rhythm.     Pulses: Normal pulses.     Heart sounds: Normal heart sounds.  Pulmonary:     Effort: Pulmonary effort is normal.     Breath sounds: Normal breath sounds.  Musculoskeletal:     Cervical back: Normal range of motion and neck supple.  Neurological:     General: No focal deficit present.     Mental Status: She is alert and oriented to person, place, and time.  Psychiatric:        Mood and Affect: Mood normal.        Behavior: Behavior normal.    ASSESSMENT AND PLAN: 1. Encounter for initial prescription of injectable contraceptive: - Counseled patient that I am unfamiliar with the financial  aspects of Depot injections and that I would be unable to counsel her on how much she would have to pay out of pocket.  - Offered patient to check with clinic front office representative for possible further advisement. Patient was agreeable at that time.  - Subsequently it was reported to me that patient declined Depot injection on today and no follow-up appointments scheduled for the same.    Patient was given the opportunity to ask questions.  Patient verbalized understanding of the plan and was able to repeat key elements of the plan. Patient was given clear instructions to go to Emergency Department or return to medical center if symptoms don't improve, worsen, or new problems develop.The patient verbalized understanding.   Return in about 3 months (around 09/26/2021) for Follow-Up or next available Depo injection with CMA .  Rema Fendt, NP

## 2021-06-27 ENCOUNTER — Ambulatory Visit (INDEPENDENT_AMBULATORY_CARE_PROVIDER_SITE_OTHER): Payer: Self-pay | Admitting: Family

## 2021-06-27 ENCOUNTER — Other Ambulatory Visit: Payer: Self-pay

## 2021-06-27 ENCOUNTER — Encounter: Payer: Self-pay | Admitting: Family

## 2021-06-27 VITALS — BP 110/78 | HR 85 | Temp 98.4°F | Resp 18 | Ht 65.67 in | Wt 110.6 lb

## 2021-06-27 DIAGNOSIS — Z30013 Encounter for initial prescription of injectable contraceptive: Secondary | ICD-10-CM

## 2021-06-27 NOTE — Patient Instructions (Signed)
Medroxyprogesterone Injection (Contraception) What is this medication? MEDROXYPROGESTERONE (me DROX ee proe JES te rone) prevents ovulation and pregnancy. It belongs to a group of medications called contraceptives. Thismedication is a progestin hormone. This medicine may be used for other purposes; ask your health care provider orpharmacist if you have questions. COMMON BRAND NAME(S): Depo-Provera, Depo-subQ Provera 104 What should I tell my care team before I take this medication? They need to know if you have any of these conditions: Asthma Blood clots Breast cancer or family history of breast cancer Depression Diabetes Eating disorder (anorexia nervosa) Heart attack High blood pressure HIV infection or AIDS If you often drink alcohol Kidney disease Liver disease Migraine headaches Osteoporosis, weak bones Seizures Stroke Tobacco smoker Vaginal bleeding An unusual or allergic reaction to medroxyprogesterone, other hormones, medications, foods, dyes, or preservatives Pregnant or trying to get pregnant Breast-feeding How should I use this medication? Depo-Provera CI contraceptive injection is given into a muscle. Depo-subQ Provera 104 injection is given under the skin. It is given in a hospital or clinic setting. The injection is usually given during the first 5 days afterthe start of a menstrual period or 6 weeks after delivery of a baby. A patient package insert for the product will be given with each prescription and refill. Be sure to read this information carefully each time. The sheet maychange often. Talk to your care team about the use of this medication in children. Special care may be needed. These injections have been used in female children who havestarted having menstrual periods. Overdosage: If you think you have taken too much of this medicine contact apoison control center or emergency room at once. NOTE: This medicine is only for you. Do not share this medicine with  others. What if I miss a dose? Keep appointments for follow-up doses. You must get an injection once every 3 months. It is important not to miss your dose. Call your care team if you areunable to keep an appointment. What may interact with this medication? Antibiotics or medications for infections, especially rifampin and griseofulvin Antivirals for HIV or hepatitis Aprepitant Armodafinil Bexarotene Bosentan Medications for seizures like carbamazepine, felbamate, oxcarbazepine, phenytoin, phenobarbital, primidone, topiramate Mitotane Modafinil St. John's wort This list may not describe all possible interactions. Give your health care provider a list of all the medicines, herbs, non-prescription drugs, or dietary supplements you use. Also tell them if you smoke, drink alcohol, or use illegaldrugs. Some items may interact with your medicine. What should I watch for while using this medication? This medication does not protect you against HIV infection (AIDS) or othersexually transmitted diseases. Use of this product may cause you to lose calcium from your bones. Loss of calcium may cause weak bones (osteoporosis). Only use this product for more than 2 years if other forms of birth control are not right for you. The longer you use this product for birth control the more likely you will be at risk forweak bones. Ask your care team how you can keep strong bones. You may have a change in bleeding pattern or irregular periods. Many femalesstop having periods while taking this medication. If you have received your injections on time, your chance of being pregnant is very low. If you think you may be pregnant, see your care team as soon aspossible. Tell your care team if you want to get pregnant within the next year. The effect of this medication may last a long time after you get your lastinjection. What side effects may I   notice from receiving this medication? Side effects that you should report to  your care team as soon as possible: Allergic reactions-skin rash, itching, hives, swelling of the face, lips, tongue, or throat Blood clot-pain, swelling, or warmth in the leg, shortness of breath, chest pain Gallbladder problems-severe stomach pain, nausea, vomiting, fever Increase in blood pressure Liver injury-right upper belly pain, loss of appetite, nausea, light-colored stool, dark yellow or brown urine, yellowing skin or eyes, unusual weakness or fatigue New or worsening migraines or headaches Seizures Stroke-sudden numbness or weakness of the face, arm, or leg, trouble speaking, confusion, trouble walking, loss of balance or coordination, dizziness, severe headache, change in vision Unusual vaginal discharge, itching, or odor Worsening mood, feelings of depression Side effects that usually do not require medical attention (report to your careteam if they continue or are bothersome): Breast pain or tenderness Dark patches of the skin on the face or other sun-exposed areas Irregular menstrual cycles or spotting Nausea Weight gain This list may not describe all possible side effects. Call your doctor for medical advice about side effects. You may report side effects to FDA at1-800-FDA-1088. Where should I keep my medication? This injection is only given by a care team. It will not be stored at home. NOTE: This sheet is a summary. It may not cover all possible information. If you have questions about this medicine, talk to your doctor, pharmacist, orhealth care provider.  2022 Elsevier/Gold Standard (2020-10-23 12:23:33)  

## 2021-06-27 NOTE — Progress Notes (Signed)
Pt presents for birth control, wants to start depo-provera, pt reports concerns with trying to gain weight

## 2021-08-11 ENCOUNTER — Other Ambulatory Visit: Payer: Self-pay

## 2021-08-11 ENCOUNTER — Encounter (HOSPITAL_BASED_OUTPATIENT_CLINIC_OR_DEPARTMENT_OTHER): Payer: Self-pay | Admitting: Emergency Medicine

## 2021-08-11 ENCOUNTER — Emergency Department (HOSPITAL_BASED_OUTPATIENT_CLINIC_OR_DEPARTMENT_OTHER)
Admission: EM | Admit: 2021-08-11 | Discharge: 2021-08-11 | Disposition: A | Discharge status: Home / Self Care | Payer: Self-pay | Attending: Emergency Medicine | Admitting: Emergency Medicine

## 2021-08-11 DIAGNOSIS — Z87891 Personal history of nicotine dependence: Secondary | ICD-10-CM | POA: Insufficient documentation

## 2021-08-11 DIAGNOSIS — Z28310 Unvaccinated for covid-19: Secondary | ICD-10-CM | POA: Insufficient documentation

## 2021-08-11 DIAGNOSIS — Z20822 Contact with and (suspected) exposure to covid-19: Secondary | ICD-10-CM | POA: Insufficient documentation

## 2021-08-11 DIAGNOSIS — B349 Viral infection, unspecified: Secondary | ICD-10-CM | POA: Insufficient documentation

## 2021-08-11 LAB — RESP PANEL BY RT-PCR (FLU A&B, COVID) ARPGX2
Influenza A by PCR: NEGATIVE
Influenza B by PCR: NEGATIVE
SARS Coronavirus 2 by RT PCR: NEGATIVE

## 2021-08-11 NOTE — ED Provider Notes (Signed)
[facility] EMERGENCY DEPT Provider Note   CSN: [id number] Arrival date & time: 08/11/21  0732     History Chief Complaint  Patient presents with   Generalized Body Aches    Christy Maldonado is a 25 y.o. female.  HPI 25 year old female presents today planing of nasal congestion and body aches.  Symptoms began yesterday.  She has not had her flu vaccine and had her last COVID-vaccine in February.  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  She was supposed to be at work today.  She denies headache, head injury, ear pain, throat pain, cough, dyspnea, abdominal pain, nausea, vomiting, diarrhea, UTI symptoms.  She states that her menses are regular and she has not missed a cycle.  She is on Depo-Provera and does not think that she is pregnant.     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     Family History  Problem Relation Age of Onset   Hypertension Mother    Diabetes Mother    Hypertension Father     Social History   Tobacco Use   Smoking status: Former    Types: Cigarettes    Quit date: 2018    Years since quitting: 4.8   Smokeless tobacco: Never  Vaping Use   Vaping Use: Every day  Substance Use Topics   Alcohol use: Not Currently   Drug use: Not Currently    Types: Marijuana    Home Medications Prior to Admission medications   Medication Sig Start Date End Date Taking? Authorizing Provider  hydrOXYzine  (ATARAX/VISTARIL) 50 MG tablet Take 50 mg by mouth 3 (three) times daily as needed.   Yes [provider]  omeprazole (PRILOSEC) 20 MG capsule Take 1 capsule (20 mg total) by mouth daily. 06/18/21  Yes Zonia Kief, Amy J, NP  Multiple Vitamin (MULTIVITAMIN WITH MINERALS) TABS tablet Take 1 tablet by mouth daily.    [provider]  norelgestromin-ethinyl estradiol Burr Medico) 150-35 MCG/24HR transdermal patch Place 1 patch onto the skin once a week. Patient not taking: Reported on 04/06/2021 11/29/20   Rema Fendt, NP  triamcinolone (KENALOG) 0.1 % Apply 1 application topically 2 (two) times daily. 11/29/20   Rema Fendt, NP    Allergies    Patient has no known allergies.  Review of Systems   Review of Systems  All other systems reviewed and are negative.  Physical Exam Updated Vital Signs BP 131/72   Pulse 99   Temp 98 F (36.7 C) (Oral)   Resp 20   SpO2 100%   Physical Exam Vitals and nursing note reviewed.  Constitutional:      General: She is not in acute distress.    Appearance: She is well-developed.  HENT:     Head: Normocephalic and atraumatic.     Right Ear: External ear normal.     Left Ear: External ear normal.  Nose: Nose normal.  Eyes:     Conjunctiva/sclera: Conjunctivae normal.     Pupils: Pupils are equal, round, and reactive to light.  Cardiovascular:     Rate and Rhythm: Normal rate and regular rhythm.  Pulmonary:     Effort: Pulmonary effort is normal.  Abdominal:     General: Abdomen is flat.     Palpations: Abdomen is soft.  Musculoskeletal:        General: Normal range of motion.     Cervical back: Normal range of motion and neck supple.  Skin:    General: Skin is warm and dry.  Neurological:     Mental Status: She is alert and oriented to person, place, and time.     Motor: No abnormal muscle tone.     Coordination: Coordination normal.  Psychiatric:        Behavior: Behavior normal.        Thought Content: Thought content  normal.    ED Results / Procedures / Treatments   Labs (all labs ordered are listed, but only abnormal results are displayed) Labs Reviewed  RESP PANEL BY RT-PCR (FLU A&B, COVID) [id number]    EKG None  Radiology No results found.  Procedures Procedures   Medications Ordered in ED Medications - No data to display  ED Course  I have reviewed the triage vital signs and the nursing notes.  Pertinent labs & imaging results that were available during my care of the patient were reviewed by me and considered in my medical decision making (see chart for details).    MDM Rules/Calculators/A&P                           Plan is to obtain COVID and flu testing here in ED.  Patient will be able to leave prior to results and will follow-up in MyChart.  Her personal treatment would not be any different.  She is given return precautions.  We have discussed her plan regarding going back to work and caring for the patient.  She is given a work note regarding this. She was counseled regarding vaping and cessation as well as obtaining flu vaccine when better. Final Clinical Impression(s) / ED Diagnoses Final diagnoses:  Viral syndrome    Rx / DC Orders ED Discharge Orders     None        Margarita Grizzle, MD 08/11/21 [id number]

## 2021-08-11 NOTE — Discharge Instructions (Signed)
Use acetaminophen or ibuprofen for any muscle aches or fever. Drink plenty of fluids Return if you are having worsening symptoms as discussed such as high fevers, confusion, shortness of breath or inability to tolerate fluids

## 2021-08-22 ENCOUNTER — Encounter: Payer: Self-pay | Admitting: *Deleted

## 2021-08-22 ENCOUNTER — Other Ambulatory Visit: Payer: Self-pay

## 2021-08-22 ENCOUNTER — Ambulatory Visit (INDEPENDENT_AMBULATORY_CARE_PROVIDER_SITE_OTHER): Payer: Self-pay | Admitting: Cardiovascular Disease

## 2021-08-22 VITALS — BP 96/64 | HR 77 | Ht 65.0 in | Wt 124.2 lb

## 2021-08-22 DIAGNOSIS — R079 Chest pain, unspecified: Secondary | ICD-10-CM

## 2021-08-22 DIAGNOSIS — R002 Palpitations: Secondary | ICD-10-CM

## 2021-08-22 NOTE — Patient Instructions (Signed)
Medication Instructions:  No changes *If you need a refill on your cardiac medications before your next appointment, please call your pharmacy*   Lab Work: None ordered If you have labs (blood work) drawn today and your tests are completely normal, you will receive your results only by: MyChart Message (if you have MyChart) OR A paper copy in the mail If you have any lab test that is abnormal or we need to change your treatment, we will call you to review the results.   Testing/Procedures: Your physician has requested that you have an echocardiogram. Echocardiography is a painless test that uses sound waves to create images of your heart. It provides your doctor with information about the size and shape of your heart and how well your heart's chambers and valves are working. You may receive an ultrasound enhancing agent through an IV if needed to better visualize your heart during the echo.This procedure takes approximately one hour. There are no restrictions for this procedure. This will take place at the [address], [address].     Follow-Up: At [facility], you and your health needs are our priority.  As part of our continuing mission to provide you with exceptional heart care, we have created designated Provider Care Teams.  These Care Teams include your primary Cardiologist (physician) and Advanced Practice Providers (APPs -  Physician Assistants and Nurse Practitioners) who all work together to provide you with the care you need, when you need it.  We recommend signing up for the patient portal called "MyChart".  Sign up information is provided on this After Visit Summary.  MyChart is used to connect with patients for Virtual Visits (Telemedicine).  Patients are able to view lab/test results, encounter notes, upcoming appointments, etc.  Non-urgent messages can be sent to your provider as well.   To learn more about what you can do with MyChart, go to [email or web address].     Your next appointment:   Follow up as needed based on the echo results

## 2021-08-22 NOTE — Progress Notes (Signed)
Cardiology Office Note:    Date:  08/24/2021   ID:  Christy Maldonado, DOB [birth year], MRN [id number]  PCP:  Rema Fendt, NP   [facility] Providers Cardiologist:  None     Referring MD: Rema Fendt, NP   No chief complaint on file. Christy Maldonado is a 25 y.o. female who is being seen today for the evaluation of abnormal electrocardiogram at the request of Rema Fendt, NP.   History of Present Illness:    Christy Maldonado is a 25 y.o. female with a hx of generally good physical health who was seen in the emergency room 04/03/2021 with atypical chest pain (intermittent left breast nonexertional nonradiating discomfort, nonpleuritic, not meal related, without associated cough/hemoptysis or dyspnea) and was found to have an abnormal ECG by EMS.  She did have some associated palpitations.  She gets those palpitations maybe once or twice a year.  The electrocardiogram from July shows mild sinus tachycardia at 104 bpm, prominent P waves in the inferior leads suggesting right atrial abnormality, ST segment depression and T wave inversion in the inferior leads as well as in leads V3, QTC 449 ms.  Emergency room evaluation showed normal labs including 2 normal troponin assays (D-dimer not performed).  The chest x-ray was normal.  PE was felt to be unlikely.  She has not had recurrence of the chest pain.  She denies dizziness, syncope, orthopnea, PND, lower extremity edema, any neurological issues.  She does have issues with anxiety.    She works as a Veterinary surgeon.  She is originally from [place], [place].  She does not take oral contraceptives.  She does not smoke cigarettes.  She vapes occasionally.  She is taking a PPI for acid reflux symptoms.  She takes hydroxyzine occasionally for anxiety.  Past Medical History:  Diagnosis Date   Anxiety    Phreesia 11/28/2020   Depression    Phreesia 11/28/2020    Past Surgical History:   Procedure Laterality Date   ARTHROSCOPIC REPAIR ACL  2016   EYE SURGERY N/A    Phreesia 11/28/2020    Current Medications: No outpatient medications have been marked as taking for the 08/22/21 encounter (Office Visit) with Thurmon Fair, MD.     Allergies:   Patient has no known allergies.   Social History   Socioeconomic History   Marital status: Single    Spouse name: Not on file   Number of children: Not on file   Years of education: Not on file   Highest education level: Not on file  Occupational History   Not on file  Tobacco Use   Smoking status: Former    Types: Cigarettes    Quit date: 2018    Years since quitting: 4.9   Smokeless tobacco: Never  Vaping Use   Vaping Use: Every day  Substance and Sexual Activity   Alcohol use: Not Currently   Drug use: Not Currently    Types: Marijuana   Sexual activity: Yes  Other Topics Concern   Not on file  Social History Narrative   Not on file   Social Determinants of Health   Financial Resource Strain: Not on file  Food Insecurity: Not on file  Transportation Needs: Not on file  Physical Activity: Not on file  Stress: Not on file  Social Connections: Not on file     Family History: The patient's family history includes Diabetes in her mother; Hypertension in her father and mother.  There is no family history of CAD  ROS:   Please see the history of present illness.     All other systems reviewed and are negative.  EKGs/Labs/Other Studies Reviewed:    The following studies were reviewed today: Review of notes, labs, ECG, x-ray images from ER visit April 03, 2021 and notes from PCP  EKG:  EKG is ordered today.  The ekg ordered today demonstrates normal sinus rhythm, completely normal tracing.  QTc 423 ms.  The changes seen on the July ECG have completely resolved.  Recent Labs: 10/31/2020: TSH 0.724 04/03/2021: BUN 7; Creatinine, Ser 0.74; Hemoglobin 13.0; Platelets 223; Potassium 3.5; Sodium 139  Recent  Lipid Panel    Component Value Date/Time   CHOL 219 (H) 01/03/2021 1015   TRIG 95 01/03/2021 1015   HDL 87 01/03/2021 1015   CHOLHDL 2.5 01/03/2021 1015   LDLCALC 116 (H) 01/03/2021 1015     Risk Assessment/Calculations:           Physical Exam:    VS:  BP 96/64   Pulse 77   Ht 5\' 5"  (1.651 m)   Wt 124 lb 3.2 oz (56.3 kg)   SpO2 99%   BMI 20.67 kg/m     Wt Readings from Last 3 Encounters:  08/22/21 124 lb 3.2 oz (56.3 kg)  06/27/21 110 lb 9.6 oz (50.2 kg)  01/03/21 107 lb 6.4 oz (48.7 kg)     GEN: Very lean, borderline underweight well nourished, well developed in no acute distress HEENT: Normal NECK: No JVD; No carotid bruits LYMPHATICS: No lymphadenopathy CARDIAC: Prominent sinus arrhythmia with respiration, otherwise RRR, no murmurs, rubs, gallops RESPIRATORY:  Clear to auscultation without rales, wheezing or rhonchi  ABDOMEN: Soft, non-tender, non-distended MUSCULOSKELETAL:  No edema; No deformity  SKIN: Warm and dry NEUROLOGIC:  Alert and oriented x 3 PSYCHIATRIC:  Normal affect   ASSESSMENT:    1. Chest pain of uncertain etiology   2. Palpitations    PLAN:    In order of problems listed above:  Chest pain/abnormal ECG: The episode that occurred in July has not recurred.  Etiology remains uncertain.  It is conceivable that hyperventilation could have caused some degree of ECG changes.  Her ECG is now completely back to normal.  Pulmonary embolism was felt to be very unlikely by the emergency room provider based on the clinical presentation.  I think is quite reasonable to check an echocardiogram for any structural abnormalities.  The symptoms do not sound consistent with coronary insufficiency and she does not have any risk factors for CAD.  If the echo is normal, I do not think she will require evaluation with more sophisticated imaging. Palpitations: If we do not identify structural abnormalities on her echo, it is unlikely that these are indicative of a  serious arrhythmia.  Since the episodes are very infrequent, I do not think a 30-day arrhythmia monitor make sense.  Consider a Kardia device or smart watch and she can send me tracings through MyChart.           Medication Adjustments/Labs and Tests Ordered: Current medicines are reviewed at length with the patient today.  Concerns regarding medicines are outlined above.  Orders Placed This Encounter  Procedures   EKG 12-Lead   ECHOCARDIOGRAM COMPLETE   No orders of the defined types were placed in this encounter.   Patient Instructions  Medication Instructions:  No changes *If you need a refill on your cardiac medications before your next appointment,  please call your pharmacy*   Lab Work: None ordered If you have labs (blood work) drawn today and your tests are completely normal, you will receive your results only by: MyChart Message (if you have MyChart) OR A paper copy in the mail If you have any lab test that is abnormal or we need to change your treatment, we will call you to review the results.   Testing/Procedures: Your physician has requested that you have an echocardiogram. Echocardiography is a painless test that uses sound waves to create images of your heart. It provides your doctor with information about the size and shape of your heart and how well your heart's chambers and valves are working. You may receive an ultrasound enhancing agent through an IV if needed to better visualize your heart during the echo.This procedure takes approximately one hour. There are no restrictions for this procedure. This will take place at the [address].     Follow-Up: At [facility], you and your health needs are our priority.  As part of our continuing mission to provide you with exceptional heart care, we have created designated Provider Care Teams.  These Care Teams include your primary Cardiologist (physician) and Advanced Practice Providers (APPs -   Physician Assistants and Nurse Practitioners) who all work together to provide you with the care you need, when you need it.  We recommend signing up for the patient portal called "MyChart".  Sign up information is provided on this After Visit Summary.  MyChart is used to connect with patients for Virtual Visits (Telemedicine).  Patients are able to view lab/test results, encounter notes, upcoming appointments, etc.  Non-urgent messages can be sent to your provider as well.   To learn more about what you can do with MyChart, go to [email or web address].    Your next appointment:   Follow up as needed based on the echo results   Signed, Thurmon Fair, MD  08/24/2021 1:38 PM    [facility]

## 2021-09-12 ENCOUNTER — Ambulatory Visit (HOSPITAL_COMMUNITY): Payer: Self-pay | Attending: Cardiology

## 2021-09-13 ENCOUNTER — Encounter (HOSPITAL_COMMUNITY): Payer: Self-pay | Admitting: Cardiovascular Disease

## 2021-09-18 NOTE — Progress Notes (Signed)
Erroneous encounter

## 2021-09-22 ENCOUNTER — Encounter: Payer: Self-pay | Admitting: Family

## 2021-09-26 ENCOUNTER — Encounter: Payer: Self-pay | Admitting: Family

## 2021-10-13 ENCOUNTER — Other Ambulatory Visit: Payer: Self-pay | Admitting: Family

## 2021-10-13 DIAGNOSIS — R1013 Epigastric pain: Secondary | ICD-10-CM

## 2021-10-15 MED ORDER — OMEPRAZOLE 20 MG PO CPDR: 20.0000 mg | DELAYED_RELEASE_CAPSULE | Freq: Every day | ORAL | 0 refills | Status: DC

## 2021-11-13 ENCOUNTER — Other Ambulatory Visit: Payer: Self-pay | Admitting: Family

## 2021-11-13 DIAGNOSIS — R1013 Epigastric pain: Secondary | ICD-10-CM

## 2021-11-13 MED ORDER — OMEPRAZOLE 20 MG PO CPDR: 20.0000 mg | DELAYED_RELEASE_CAPSULE | Freq: Every day | ORAL | 0 refills | Status: DC

## 2021-11-20 ENCOUNTER — Other Ambulatory Visit: Payer: Self-pay

## 2021-11-20 ENCOUNTER — Ambulatory Visit (HOSPITAL_COMMUNITY): Payer: Commercial Managed Care - HMO | Attending: Cardiovascular Disease

## 2021-11-20 DIAGNOSIS — R079 Chest pain, unspecified: Secondary | ICD-10-CM | POA: Insufficient documentation

## 2021-11-20 DIAGNOSIS — R002 Palpitations: Secondary | ICD-10-CM | POA: Insufficient documentation

## 2021-11-20 LAB — ECHOCARDIOGRAM COMPLETE
Area-P 1/2: 5.18 cm2
S' Lateral: 2.2 cm

## 2021-11-23 ENCOUNTER — Encounter (HOSPITAL_BASED_OUTPATIENT_CLINIC_OR_DEPARTMENT_OTHER): Payer: Self-pay | Admitting: *Deleted

## 2021-11-23 ENCOUNTER — Other Ambulatory Visit: Payer: Self-pay

## 2021-11-23 ENCOUNTER — Emergency Department (HOSPITAL_BASED_OUTPATIENT_CLINIC_OR_DEPARTMENT_OTHER)
Admission: EM | Admit: 2021-11-23 | Discharge: 2021-11-23 | Disposition: A | Discharge status: Home / Self Care | Payer: Commercial Managed Care - HMO | Attending: Emergency Medicine | Admitting: Emergency Medicine

## 2021-11-23 DIAGNOSIS — R1111 Vomiting without nausea: Secondary | ICD-10-CM

## 2021-11-23 DIAGNOSIS — E876 Hypokalemia: Secondary | ICD-10-CM | POA: Diagnosis not present

## 2021-11-23 DIAGNOSIS — Z20822 Contact with and (suspected) exposure to covid-19: Secondary | ICD-10-CM | POA: Insufficient documentation

## 2021-11-23 DIAGNOSIS — R197 Diarrhea, unspecified: Secondary | ICD-10-CM | POA: Insufficient documentation

## 2021-11-23 DIAGNOSIS — R112 Nausea with vomiting, unspecified: Secondary | ICD-10-CM | POA: Insufficient documentation

## 2021-11-23 LAB — COMPREHENSIVE METABOLIC PANEL
ALT: 7 U/L (ref 0–44)
AST: 12 U/L — ABNORMAL LOW (ref 15–41)
Albumin: 4.8 g/dL (ref 3.5–5.0)
Alkaline Phosphatase: 77 U/L (ref 38–126)
Anion gap: 12 (ref 5–15)
BUN: 6 mg/dL (ref 6–20)
CO2: 22 mmol/L (ref 22–32)
Calcium: 9.9 mg/dL (ref 8.9–10.3)
Chloride: 104 mmol/L (ref 98–111)
Creatinine, Ser: 0.95 mg/dL (ref 0.44–1.00)
GFR, Estimated: >60 mL/min (ref >60)
Glucose, Bld: 82 mg/dL (ref 70–99)
Potassium: 3 mmol/L — ABNORMAL LOW (ref 3.5–5.1)
Sodium: 138 mmol/L (ref 135–145)
Total Bilirubin: 0.5 mg/dL (ref 0.3–1.2)
Total Protein: 7.6 g/dL (ref 6.5–8.1)

## 2021-11-23 LAB — URINALYSIS, ROUTINE W REFLEX MICROSCOPIC
Bilirubin Urine: NEGATIVE
Glucose, UA: NEGATIVE mg/dL
Ketones, ur: 15 mg/dL — AB
Leukocytes,Ua: NEGATIVE
Nitrite: NEGATIVE
Protein, ur: 100 mg/dL — AB
Specific Gravity, Urine: 1.029 (ref 1.005–1.030)
pH: 5.5 (ref 5.0–8.0)

## 2021-11-23 LAB — PREGNANCY, URINE: Preg Test, Ur: NEGATIVE

## 2021-11-23 LAB — CBC
HCT: 41.4 % (ref 36.0–46.0)
Hemoglobin: 13.2 g/dL (ref 12.0–15.0)
MCH: 26.2 pg (ref 26.0–34.0)
MCHC: 31.9 g/dL (ref 30.0–36.0)
MCV: 82.1 fL (ref 80.0–100.0)
Platelets: 263 10*3/uL (ref 150–400)
RBC: 5.04 MIL/uL (ref 3.87–5.11)
RDW: 15.3 % (ref 11.5–15.5)
WBC: 8 10*3/uL (ref 4.0–10.5)
nRBC: 0 % (ref 0.0–0.2)

## 2021-11-23 LAB — RESP PANEL BY RT-PCR (FLU A&B, COVID) ARPGX2
Influenza A by PCR: NEGATIVE
Influenza B by PCR: NEGATIVE
SARS Coronavirus 2 by RT PCR: NEGATIVE

## 2021-11-23 MED ORDER — POTASSIUM CHLORIDE CRYS ER 20 MEQ PO TBCR
40.0000 meq | EXTENDED_RELEASE_TABLET | Freq: Once | ORAL | Status: DC
Start: 2021-11-23 — End: 2021-11-23
  Filled 2021-11-23: qty 2

## 2021-11-23 MED ORDER — SODIUM CHLORIDE 0.9 % IV BOLUS
1000.0000 mL | Freq: Once | INTRAVENOUS | Status: AC
  Administered 2021-11-23: 1000 mL via INTRAVENOUS

## 2021-11-23 NOTE — ED Notes (Signed)
EMT-P provided AVS using Teachback Method. Patient verbalizes understanding of Discharge Instructions. Opportunity for Questioning and Answers were provided by EMT-P. Patient Discharged from ED.  ? ?

## 2021-11-23 NOTE — Discharge Instructions (Addendum)
Please eat some bananas to help replete some of your potassium.  You could also drink sports drinks.  Please follow-up with your primary care provider.  Return to the emergency department for worsening symptoms.

## 2021-11-23 NOTE — ED Triage Notes (Signed)
Pt is here for diarrhea x one week.  Pt states that she has had some nausea and she vomited x1 today.

## 2021-11-23 NOTE — ED Provider Notes (Signed)
[facility] EMERGENCY DEPT Provider Note   CSN: [id number] Arrival date & time: 11/23/21  1536     History Chief Complaint  Patient presents with   Diarrhea    Christy Maldonado is a 26 y.o. female with no significant past medical history presents the emergency department with a 1 week history of intermittent diarrhea.  Patient states she nausea and vomiting started today.  She has no abdominal pain.  Patient currently on her menstrual cycle.  No fever or chills.  No cough, congestion, sore throat, headache, general malaise, urinary complaints.  No sick contacts.   Diarrhea     Home Medications Prior to Admission medications   Medication Sig Start Date End Date Taking? Authorizing Provider  hydrOXYzine (ATARAX/VISTARIL) 50 MG tablet Take 50 mg by mouth 3 (three) times daily as needed.    [provider]  Multiple Vitamin (MULTIVITAMIN WITH MINERALS) TABS tablet Take 1 tablet by mouth daily.    [provider]  omeprazole (PRILOSEC) 20 MG capsule Take 1 capsule (20 mg total) by mouth daily. 11/13/21   Rema Fendt, NP      Allergies    Patient has no known allergies.    Review of Systems   Review of Systems  Gastrointestinal:  Positive for diarrhea.  All other systems reviewed and are negative.  Physical Exam Updated Vital Signs BP 112/80    Pulse 96    Temp 99 F (37.2 C) (Oral)    Resp 16    Wt 51.7 kg    SpO2 100%    BMI 18.97 kg/m  Physical Exam Vitals and nursing note reviewed.  Constitutional:      General: She is not in acute distress.    Appearance: Normal appearance.  HENT:     Head: Normocephalic and atraumatic.  Eyes:     General:        Right eye: No discharge.        Left eye: No discharge.  Cardiovascular:     Comments: Tachycardic.  S1/S2 are distinct without any evidence of murmur, rubs, or gallops.  Radial pulses are 2+ bilaterally.  Dorsalis pedis pulses are 2+ bilaterally.  No evidence of pedal  edema. Pulmonary:     Comments: Clear to auscultation bilaterally.  Normal effort.  No respiratory distress.  No evidence of wheezes, rales, or rhonchi heard throughout. Abdominal:     General: Abdomen is flat. Bowel sounds are normal. There is no distension.     Tenderness: There is no abdominal tenderness. There is no guarding or rebound.  Musculoskeletal:        General: Normal range of motion.     Cervical back: Neck supple.  Skin:    General: Skin is warm and dry.     Findings: No rash.  Neurological:     General: No focal deficit present.     Mental Status: She is alert.  Psychiatric:        Mood and Affect: Mood normal.        Behavior: Behavior normal.    ED Results / Procedures / Treatments   Labs (all labs ordered are listed, but only abnormal results are displayed) Labs Reviewed  COMPREHENSIVE METABOLIC PANEL - Abnormal; Notable for the following components:      Result Value   Potassium 3.0 (*)    AST 12 (*)    All other components within normal limits  URINALYSIS, ROUTINE W REFLEX MICROSCOPIC - Abnormal; Notable for the following  components:   Hgb urine dipstick LARGE (*)    Ketones, ur 15 (*)    Protein, ur 100 (*)    All other components within normal limits  RESP PANEL BY RT-PCR (FLU A&B, COVID) ARPGX2  CBC  PREGNANCY, URINE    EKG None  Radiology No results found.  Procedures Procedures    Medications Ordered in ED Medications  potassium chloride SA (KLOR-CON M) CR tablet 40 mEq (40 mEq Oral Patient Refused/Not Given 11/23/21 1801)  sodium chloride 0.9 % bolus 1,000 mL (0 mLs Intravenous Stopped 11/23/21 1753)    ED Course/ Medical Decision Making/ A&P                           Medical Decision Making Amount and/or Complexity of Data Reviewed Labs: ordered.  Risk Prescription drug management.   This patient presents to the ED for concern of diarrhea and vomiting, this involves an extensive number of treatment options, and is a complaint  that carries with it a high risk of complications and morbidity.  The differential diagnosis includes viral gastroenteritis, GERD, PUD.  I doubt appendicitis, pancreatitis, diverticulitis, bacterial gastroenteritis.   Co morbidities that complicate the patient evaluation  None   Additional history obtained:  Additional history obtained from old records and nursing note. External records from outside source obtained and reviewed including old ED visits.  No relevant documentation impacting care today.   Lab Tests:  I Ordered, and personally interpreted labs.  The pertinent results include: CBC was without any evidence of leukocytosis or anemia.  CMP did show evidence of hypokalemia.  Ordered potassium repletion in the emergency department which patient denied.  Respiratory panel was negative for COVID and flu.  Urinalysis was without signs of infection.  Pregnancy was negative.   Cardiac Monitoring:  The patient was maintained on a cardiac monitor.  I personally viewed and interpreted the cardiac monitored which showed an underlying rhythm of: Cardiac but regular.   Medicines ordered and prescription drug management:  I ordered medication including bolus fluid for dehydration Reevaluation of the patient after these medicines showed that the patient improved I have reviewed the patients home medicines and have made adjustments as needed   Test Considered:  CT abdomen pelvis with contrast.  Patient is nontender to palpation across the entire abdomen.  She has good bowel sounds.  Will defer imaging for now.   Critical Interventions:  Liter of fluid for dehydration.  Tachycardia improved.   Problem List / ED Course:  Nausea, vomiting, and diarrhea.  Likely viral gastroenteritis.  Patient feeling much better after a liter of fluids.  Tachycardia improved after liter of fluids.  Vital signs are completely normal.  Again patient having no abdominal pain do not feel imaging is  warranted at this time.  I attempted to replete her potassium in the emergency department patient denied taking the pills.  I will have her eat bananas and get some electrolytes orally with sports drinks.   Reevaluation:  After the interventions noted above, I reevaluated the patient and found that they have :improved   Dispostion:  After consideration of the diagnostic results and the patients response to treatment, I feel that the patent would benefit from outpatient follow-up.  I discussed all results of the labs at the bedside with the patient.  I discussed strict return precautions with the patient.  She expressed full understanding.  Patient is feeling better.  Vital signs normal.  She is safe for discharge.  Final Clinical Impression(s) / ED Diagnoses Final diagnoses:  Diarrhea, unspecified type  Vomiting without nausea, unspecified vomiting type    Rx / DC Orders ED Discharge Orders     None         Jolyn Lent 11/23/21 1820    Terrilee Files, MD 11/24/21 1032

## 2021-11-26 ENCOUNTER — Telehealth: Payer: Managed Care, Other (non HMO) | Admitting: Nurse Practitioner

## 2021-11-26 DIAGNOSIS — H1132 Conjunctival hemorrhage, left eye: Secondary | ICD-10-CM | POA: Diagnosis not present

## 2021-11-26 NOTE — Progress Notes (Signed)
Virtual Visit Consent   Christy Maldonado, you are scheduled for a virtual visit with a [place] provider today.     Just as with appointments in the office, your consent must be obtained to participate.  Your consent will be active for this visit and any virtual visit you may have with one of our providers in the next 365 days.     If you have a MyChart account, a copy of this consent can be sent to you electronically.  All virtual visits are billed to your insurance company just like a traditional visit in the office.    As this is a virtual visit, video technology does not allow for your provider to perform a traditional examination.  This may limit your provider's ability to fully assess your condition.  If your provider identifies any concerns that need to be evaluated in person or the need to arrange testing (such as labs, EKG, etc.), we will make arrangements to do so.     Although advances in technology are sophisticated, we cannot ensure that it will always work on either your end or our end.  If the connection with a video visit is poor, the visit may have to be switched to a telephone visit.  With either a video or telephone visit, we are not always able to ensure that we have a secure connection.     I need to obtain your verbal consent now.   Are you willing to proceed with your visit today?    Elese Rane has provided verbal consent on 11/26/2021 for a virtual visit (video or telephone).   Viviano Simas, FNP   Date: 11/26/2021 12:05 PM   Virtual Visit via Video Note   I, Viviano Simas, connected with  Christy Maldonado  ([id number], 02/23/1996) on 11/26/21 at 12:30 PM EST by a video-enabled telemedicine application and verified that I am speaking with the correct person using two identifiers.  Location: Patient: Virtual Visit Location Patient: Home Provider: Virtual Visit Location Provider: Home Office   I discussed the limitations of  evaluation and management by telemedicine and the availability of in person appointments. The patient expressed understanding and agreed to proceed.    History of Present Illness: Christy Maldonado is a 26 y.o. who identifies as a female who was assigned female at birth, and is being seen today after she has noticed a blood vessel that ruptured in her eye. This happened yesterday morning.   She has been sneezing a lot from increased pollen.   This has happened in the past from a sneeze or a cough.   Denies visual changes  Is looking for instruction on what to use in her eye for relief of redness   Problems:  Patient Active Problem List   Diagnosis Date Noted   Anxiety state 11/02/2020   Constipation 11/02/2020   Psychophysiological insomnia 11/02/2020   Dyspepsia 11/02/2020   Tachycardia 11/02/2020   Panic attacks 09/30/2016    Allergies: No Known Allergies Medications:  Current Outpatient Medications:    hydrOXYzine (ATARAX/VISTARIL) 50 MG tablet, Take 50 mg by mouth 3 (three) times daily as needed., Disp: , Rfl:    Multiple Vitamin (MULTIVITAMIN WITH MINERALS) TABS tablet, Take 1 tablet by mouth daily., Disp: , Rfl:    omeprazole (PRILOSEC) 20 MG capsule, Take 1 capsule (20 mg total) by mouth daily., Disp: 90 capsule, Rfl: 0  Observations/Objective: Patient is well-developed, well-nourished in no acute distress.  Resting comfortably  at  home.  Head is normocephalic, atraumatic.  No labored breathing.  Speech is clear and coherent with logical content.  Patient is alert and oriented at baseline.  Left sclera with visible vessel to left outer sclera  Lids and lashes appear WNL Able to lift lid without discomfort  No drainage noted  Right eye WNL  Assessment and Plan: 1. Burst blood vessel of left eye Advised using saline drops or lubricated drops without red eye relief OTC to keep eye moisturized.   Continue allergy relief   Advil or ibuprofen for  inflammation relief     Seek follow up with any new symptoms or changed in vision  Follow Up Instructions: I discussed the assessment and treatment plan with the patient. The patient was provided an opportunity to ask questions and all were answered. The patient agreed with the plan and demonstrated an understanding of the instructions.  A copy of instructions were sent to the patient via MyChart unless otherwise noted below.    The patient was advised to call back or seek an in-person evaluation if the symptoms worsen or if the condition fails to improve as anticipated.  Time:  I spent 10 minutes with the patient via telehealth technology discussing the above problems/concerns.    Viviano Simas, FNP

## 2021-11-27 ENCOUNTER — Telehealth: Payer: Managed Care, Other (non HMO) | Admitting: Physician Assistant

## 2021-11-27 DIAGNOSIS — H01004 Unspecified blepharitis left upper eyelid: Secondary | ICD-10-CM

## 2021-11-27 MED ORDER — POLYMYXIN B-TRIMETHOPRIM 10000-0.1 UNIT/ML-% OP SOLN: OPHTHALMIC | 0 refills | Status: DC

## 2021-11-27 NOTE — Patient Instructions (Signed)
°  Christy Maldonado, thank you for joining Leeanne Rio, PA-C for today's virtual visit.  While this provider is not your primary care provider (PCP), if your PCP is located in our provider database this encounter information will be shared with them immediately following your visit.  Consent: (Patient) Christy Maldonado provided verbal consent for this virtual visit at the beginning of the encounter.  Current Medications:  Current Outpatient Medications:    hydrOXYzine (ATARAX/VISTARIL) 50 MG tablet, Take 50 mg by mouth 3 (three) times daily as needed., Disp: , Rfl:    Multiple Vitamin (MULTIVITAMIN WITH MINERALS) TABS tablet, Take 1 tablet by mouth daily., Disp: , Rfl:    omeprazole (PRILOSEC) 20 MG capsule, Take 1 capsule (20 mg total) by mouth daily., Disp: 90 capsule, Rfl: 0   Medications ordered in this encounter:  No orders of the defined types were placed in this encounter.    *If you need refills on other medications prior to your next appointment, please contact your pharmacy*  Follow-Up: Call back or seek an in-person evaluation if the symptoms worsen or if the condition fails to improve as anticipated.  Other Instructions Please keep hydrated. Keep hands washed. Avoid touching the eye. No wearing contacts until this is resolved. Apply warm compresses a couple of times per day as discussed.  Take the antibiotic drop as directed.  If anything is worsening or not resolving despite treatment, you need an in-person evaluation ASAP. Do NOT DELAY CARE!   If you have been instructed to have an in-person evaluation today at a local Urgent Care facility, please use the link below. It will take you to a list of all of our available [facility], including address, phone number and hours of operation. Please do not delay care.  [facility]  If you or a family member do not have a primary care provider, use the link below to  schedule a visit and establish care. When you choose a [facility] primary care physician or advanced practice provider, you gain a long-term partner in health. Find a Primary Care Provider  Learn more about [place]'s in-office and virtual care options: [place] Now

## 2021-11-27 NOTE — Progress Notes (Signed)
Virtual Visit Consent   Christy Maldonado, you are scheduled for a virtual visit with a [facility] provider today.     Just as with appointments in the office, your consent must be obtained to participate.  Your consent will be active for this visit and any virtual visit you may have with one of our providers in the next 365 days.     If you have a MyChart account, a copy of this consent can be sent to you electronically.  All virtual visits are billed to your insurance company just like a traditional visit in the office.    As this is a virtual visit, video technology does not allow for your provider to perform a traditional examination.  This may limit your provider's ability to fully assess your condition.  If your provider identifies any concerns that need to be evaluated in person or the need to arrange testing (such as labs, EKG, etc.), we will make arrangements to do so.     Although advances in technology are sophisticated, we cannot ensure that it will always work on either your end or our end.  If the connection with a video visit is poor, the visit may have to be switched to a telephone visit.  With either a video or telephone visit, we are not always able to ensure that we have a secure connection.     I need to obtain your verbal consent now.   Are you willing to proceed with your visit today?    Christy Maldonado has provided verbal consent on 11/27/2021 for a virtual visit (video or telephone).   Piedad Climes, [place]   Date: 11/27/2021 10:57 AM   Virtual Visit via Video Note   I, Piedad Climes, connected with  Christy Maldonado  ([id number], 16-Apr-1996) on 11/27/21 at 11:00 AM EST by a video-enabled telemedicine application and verified that I am speaking with the correct person using two identifiers.  Location: Patient: Virtual Visit Location Patient: Home Provider: Virtual Visit Location Provider: Home Office   I discussed the  limitations of evaluation and management by telemedicine and the availability of in person appointments. The patient expressed understanding and agreed to proceed.    History of Present Illness: Christy Maldonado is a 26 y.o. who identifies as a female who was assigned female at birth, and is being seen today for redness and tenderness of L upper eyelid with drainage noted today. Was seen yesterday for a irritated blood vessel of left eye after a day or so of significant sneezing. Was given reassurance and supportive measures to follow and instructed to follow-up for any new or worsening symptoms. Denies fever, chills, aches, URI symptoms. Denies decreased EOM or change in vision. Denies symptoms of R eye.   HPI: HPI  Problems:  Patient Active Problem List   Diagnosis Date Noted   Anxiety state 11/02/2020   Constipation 11/02/2020   Psychophysiological insomnia 11/02/2020   Dyspepsia 11/02/2020   Tachycardia 11/02/2020   Panic attacks 09/30/2016    Allergies: No Known Allergies Medications:  Current Outpatient Medications:    trimethoprim-polymyxin b (POLYTRIM) ophthalmic solution, Apply 1-2 drops into affected eye QID x 5 days., Disp: 10 mL, Rfl: 0   Multiple Vitamin (MULTIVITAMIN WITH MINERALS) TABS tablet, Take 1 tablet by mouth daily., Disp: , Rfl:    omeprazole (PRILOSEC) 20 MG capsule, Take 1 capsule (20 mg total) by mouth daily., Disp: 90 capsule, Rfl: 0  Observations/Objective: Patient is well-developed,  well-nourished in no acute distress.  Resting comfortably at home.  Head is normocephalic, atraumatic.  No labored breathing. Speech is clear and coherent with logical content.  Patient is alert and oriented at baseline.  L upper eyelid with mild swelling and redness with drainage noted. Conjunctiva without evidence of subconjunctival hemorrhage. Mild lateral injection noted. R eye within normal limits. EOMI.   Assessment and Plan: 1. Blepharitis of left upper  eyelid, unspecified type - trimethoprim-polymyxin b (POLYTRIM) ophthalmic solution; Apply 1-2 drops into affected eye QID x 5 days.  Dispense: 10 mL; Refill: 0  Blepharitis and conjunctivitis of L eye and upper eyelid. No evidence of periorbital cellulitis. Start Polytrim. Supportive measures and OTC medications reviewed. Strict in-person precautions reviewed.   Follow Up Instructions: I discussed the assessment and treatment plan with the patient. The patient was provided an opportunity to ask questions and all were answered. The patient agreed with the plan and demonstrated an understanding of the instructions.  A copy of instructions were sent to the patient via MyChart unless otherwise noted below.   The patient was advised to call back or seek an in-person evaluation if the symptoms worsen or if the condition fails to improve as anticipated.  Time:  I spent 15 minutes with the patient via telehealth technology discussing the above problems/concerns.    Piedad Climes, PA-C

## 2021-11-30 ENCOUNTER — Emergency Department (HOSPITAL_BASED_OUTPATIENT_CLINIC_OR_DEPARTMENT_OTHER)
Admission: EM | Admit: 2021-11-30 | Discharge: 2021-11-30 | Disposition: A | Discharge status: Home / Self Care | Payer: Commercial Managed Care - HMO | Attending: Emergency Medicine | Admitting: Emergency Medicine

## 2021-11-30 ENCOUNTER — Encounter (HOSPITAL_BASED_OUTPATIENT_CLINIC_OR_DEPARTMENT_OTHER): Payer: Self-pay | Admitting: Emergency Medicine

## 2021-11-30 ENCOUNTER — Other Ambulatory Visit (HOSPITAL_BASED_OUTPATIENT_CLINIC_OR_DEPARTMENT_OTHER): Payer: Self-pay

## 2021-11-30 ENCOUNTER — Emergency Department (HOSPITAL_BASED_OUTPATIENT_CLINIC_OR_DEPARTMENT_OTHER): Payer: Commercial Managed Care - HMO

## 2021-11-30 ENCOUNTER — Other Ambulatory Visit: Payer: Self-pay

## 2021-11-30 ENCOUNTER — Telehealth: Payer: Self-pay | Admitting: Physician Assistant

## 2021-11-30 DIAGNOSIS — H5789 Other specified disorders of eye and adnexa: Secondary | ICD-10-CM

## 2021-11-30 DIAGNOSIS — L03213 Periorbital cellulitis: Secondary | ICD-10-CM | POA: Diagnosis present

## 2021-11-30 DIAGNOSIS — H5462 Unqualified visual loss, left eye, normal vision right eye: Secondary | ICD-10-CM

## 2021-11-30 LAB — CBC WITH DIFFERENTIAL/PLATELET
Abs Immature Granulocytes: 0.03 10*3/uL (ref 0.00–0.07)
Basophils Absolute: 0.1 10*3/uL (ref 0.0–0.1)
Basophils Relative: 1 %
Eosinophils Absolute: 0.1 10*3/uL (ref 0.0–0.5)
Eosinophils Relative: 1 %
HCT: 40.8 % (ref 36.0–46.0)
Hemoglobin: 13 g/dL (ref 12.0–15.0)
Immature Granulocytes: 0 %
Lymphocytes Relative: 21 %
Lymphs Abs: 1.9 10*3/uL (ref 0.7–4.0)
MCH: 26.3 pg (ref 26.0–34.0)
MCHC: 31.9 g/dL (ref 30.0–36.0)
MCV: 82.6 fL (ref 80.0–100.0)
Monocytes Absolute: 0.4 10*3/uL (ref 0.1–1.0)
Monocytes Relative: 5 %
Neutro Abs: 6.3 10*3/uL (ref 1.7–7.7)
Neutrophils Relative %: 72 %
Platelets: 332 10*3/uL (ref 150–400)
RBC: 4.94 MIL/uL (ref 3.87–5.11)
RDW: 14.9 % (ref 11.5–15.5)
WBC: 8.8 10*3/uL (ref 4.0–10.5)
nRBC: 0 % (ref 0.0–0.2)

## 2021-11-30 LAB — BASIC METABOLIC PANEL
Anion gap: 11 (ref 5–15)
BUN: <5 mg/dL — ABNORMAL LOW (ref 6–20)
CO2: 23 mmol/L (ref 22–32)
Calcium: 9.8 mg/dL (ref 8.9–10.3)
Chloride: 105 mmol/L (ref 98–111)
Creatinine, Ser: 0.78 mg/dL (ref 0.44–1.00)
GFR, Estimated: >60 mL/min (ref >60)
Glucose, Bld: 87 mg/dL (ref 70–99)
Potassium: 3.5 mmol/L (ref 3.5–5.1)
Sodium: 139 mmol/L (ref 135–145)

## 2021-11-30 MED ORDER — IOHEXOL 300 MG/ML  SOLN
100.0000 mL | Freq: Once | INTRAMUSCULAR | Status: AC | PRN
  Administered 2021-11-30: 100 mL via INTRAVENOUS

## 2021-11-30 MED ORDER — CEPHALEXIN 500 MG PO CAPS
500.0000 mg | ORAL_CAPSULE | Freq: Three times a day (TID) | ORAL | 0 refills | Status: AC
  Filled 2021-11-30: qty 20, 7d supply, fill #0

## 2021-11-30 NOTE — Discharge Instructions (Signed)
Your blood work and CT scan was normal.  Follow-up with ophthalmologist within the next 2 to 3 days.  Return immediately if you have worsening symptoms or any additional concerns or vision change. ? ? ?

## 2021-11-30 NOTE — Patient Instructions (Signed)
?  Christy Maldonado, thank you for joining Christy Loveless, PA-C for today's virtual visit.  While this provider is not your primary care provider (PCP), if your PCP is located in our provider database this encounter information will be shared with them immediately following your visit. ? ?Consent: ?(Patient) Christy Maldonado provided verbal consent for this virtual visit at the beginning of the encounter. ? ?Current Medications: ? ?Current Outpatient Medications:  ?  Multiple Vitamin (MULTIVITAMIN WITH MINERALS) TABS tablet, Take 1 tablet by mouth daily., Disp: , Rfl:  ?  omeprazole (PRILOSEC) 20 MG capsule, Take 1 capsule (20 mg total) by mouth daily., Disp: 90 capsule, Rfl: 0 ?  trimethoprim-polymyxin b (POLYTRIM) ophthalmic solution, Apply 1-2 drops into affected eye QID x 5 days., Disp: 10 mL, Rfl: 0  ? ?Medications ordered in this encounter:  ?No orders of the defined types were placed in this encounter. ?  ? ?*If you need refills on other medications prior to your next appointment, please contact your pharmacy* ? ?Follow-Up: ?Call back or seek an in-person evaluation if the symptoms worsen or if the condition fails to improve as anticipated. ? ?Other Instructions ?[place]  ? ?[facility] [phone]  ? ?[facility] [phone]  ? ?[place]  ? ?[facility] [phone] and [phone] and [phone]  (3 locations)  ? ?[place]  ? ?[facility] [phone]  ? ?[place]  ? ?[facility] [phone]  ? ?[place]  ? ?[facility] [phone]  ? ?[place]  ? ?[facility] [phone]  ? ?[facility] [phone] or [phone]  ? ?  ? ? ? ?If you have been instructed to have an in-person evaluation today at a local Urgent Care facility, please use the link below. It will take you to a list of all of our available [facility], including address, phone number and hours of operation. Please do  not delay care.  ?[facility] ? ?If you or a family member do not have a primary care provider, use the link below to schedule a visit and establish care. When you choose a [facility] primary care physician or advanced practice provider, you gain a long-term partner in health. ?Find a Primary Care Provider ? ?Learn more about [facility]'s in-office and virtual care options: ?[facility] Get Care Now ?

## 2021-11-30 NOTE — Progress Notes (Signed)
?Virtual Visit Consent  ? ?Christy Maldonado, you are scheduled for a virtual visit with a [facility] provider today.   ?  ?Just as with appointments in the office, your consent must be obtained to participate.  Your consent will be active for this visit and any virtual visit you may have with one of our providers in the next 365 days.   ?  ?If you have a MyChart account, a copy of this consent can be sent to you electronically.  All virtual visits are billed to your insurance company just like a traditional visit in the office.   ? ?As this is a virtual visit, video technology does not allow for your provider to perform a traditional examination.  This may limit your provider's ability to fully assess your condition.  If your provider identifies any concerns that need to be evaluated in person or the need to arrange testing (such as labs, EKG, etc.), we will make arrangements to do so.   ?  ?Although advances in technology are sophisticated, we cannot ensure that it will always work on either your end or our end.  If the connection with a video visit is poor, the visit may have to be switched to a telephone visit.  With either a video or telephone visit, we are not always able to ensure that we have a secure connection.    ? ?I need to obtain your verbal consent now.   Are you willing to proceed with your visit today?  ?  ?Boots Mcglown has provided verbal consent on 11/30/2021 for a virtual visit (video or telephone). ?  ?Margaretann Loveless, PA-C  ? ?Date: 11/30/2021 9:08 AM ? ? ?Virtual Visit via Video Note  ? ?IMargaretann Loveless, connected with  Christy Maldonado  ([id number], [birth year]) on 11/30/21 at  9:00 AM EST by a video-enabled telemedicine application and verified that I am speaking with the correct person using two identifiers. ? ?Location: ?Patient: Virtual Visit Location Patient: Home ?Provider: Virtual Visit Location Provider: Home Office ?  ?I discussed the  limitations of evaluation and management by telemedicine and the availability of in person appointments. The patient expressed understanding and agreed to proceed.   ? ?History of Present Illness: ?Christy Maldonado is a 26 y.o. who identifies as a female who was assigned female at birth, and is being seen today for possible pink eye. ? ?HPI: Conjunctivitis  ?The current episode started today. The onset was sudden. The problem occurs continuously. The problem has been gradually worsening. The problem is mild. Nothing relieves the symptoms. Associated symptoms include decreased vision, congestion, eye discharge, eye pain and eye redness. Pertinent negatives include no fever, no photophobia and no headaches. The left eye is affected.   ?Was seen on 11/26/21 for a ruptured blood vessel in the left eye. Conservative management discussed and advised to follow up if worsening. Was seen again virtually on 11/27/21 with swelling of the eye lid and continued eye symptoms. Started on Polytrim eye drops. Has been using but no improvement. Then this morning her eye was crusted shut, still having thick, dry discharge, and limited vision.  ? ?Problems:  ?Patient Active Problem List  ? Diagnosis Date Noted  ? Anxiety state 11/02/2020  ? Constipation 11/02/2020  ? 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 Dyspepsia 11/02/2020  ? Tachycardia 11/02/2020  ? Panic attacks 09/30/2016  ?  ?Allergies: No Known Allergies ?Medications:  ?Current Outpatient Medications:  ?  Multiple Vitamin (MULTIVITAMIN  WITH MINERALS) TABS tablet, Take 1 tablet by mouth daily., Disp: , Rfl:  ?  omeprazole (PRILOSEC) 20 MG capsule, Take 1 capsule (20 mg total) by mouth daily., Disp: 90 capsule, Rfl: 0 ?  trimethoprim-polymyxin b (POLYTRIM) ophthalmic solution, Apply 1-2 drops into affected eye QID x 5 days., Disp: 10 mL, Rfl: 0 ? ?Observations/Objective: ?Patient is well-developed, well-nourished in no acute distress.  ?Resting comfortably at  home.  ?Head is normocephalic, atraumatic.  ?No labored breathing.  ?Speech is clear and coherent with logical content.  ?Patient is alert and oriented at baseline.  ? ? ?Assessment and Plan: ?1. Discharge of eye, left ? ?2. Decreased vision of left eye ? ?- Ongoing issue worsening despite being on Polytrim eye drops ?- Advised to seek care in person, list of eye doctors provided via AVS ?- If unable to be seen, needs to proceed to ER for further evaluation ? ?Follow Up Instructions: ?I discussed the assessment and treatment plan with the patient. The patient was provided an opportunity to ask questions and all were answered. The patient agreed with the plan and demonstrated an understanding of the instructions.  A copy of instructions were sent to the patient via MyChart unless otherwise noted below.  ? ?The patient was advised to call back or seek an in-person evaluation if the symptoms worsen or if the condition fails to improve as anticipated. ? ?Time:  ?I spent 10 minutes with the patient via telehealth technology discussing the above problems/concerns.   ? ?Margaretann Loveless, PA-C ?

## 2021-11-30 NOTE — ED Triage Notes (Signed)
Left eye swelling , reports eye vessel injury a week ago , was seen and given eye drops , today vision changes and left eye lid swelling .  ?

## 2021-11-30 NOTE — ED Provider Notes (Signed)
?[facility]PT ?Provider Note ? ? ?CSN: [id number] ?Arrival date & time: 11/30/21  0936 ? ?  ? ?History ? ?Chief Complaint  ?Patient presents with  ? Eye Problem  ? ? ?Christy Maldonado is a 26 y.o. female. ? ?Patient presents ER chief complaint of left eye irritation and swelling of the eyelid.  She states that she developed subconjunctival hemorrhage a week ago, was given antibiotic eyedrops polymyxin to use.  She has been using it all week, today noticed increased swelling of the left eyelid and some increased mucus discharge to the eye.  She states it is painful with extraocular motion in the left eye now.  Otherwise denies any fevers.  Denies vomiting or diarrhea.  She has a history of right eye strabismus. ? ? ?  ? ?Home Medications ?Prior to Admission medications   ?Medication Sig Start Date End Date Taking? Authorizing Provider  ?cephALEXin (KEFLEX) 500 MG capsule Take 1 capsule (500 mg total) by mouth 3 (three) times daily for 7 days. 11/30/21 12/07/21 Yes Cheryll Cockayne, MD  ?Multiple Vitamin (MULTIVITAMIN WITH MINERALS) TABS tablet Take 1 tablet by mouth daily.    [provider]  ?omeprazole (PRILOSEC) 20 MG capsule Take 1 capsule (20 mg total) by mouth daily. 11/13/21   Rema Fendt, NP  ?trimethoprim-polymyxin b (POLYTRIM) ophthalmic solution Apply 1-2 drops into affected eye QID x 5 days. 11/27/21   Waldon Merl, PA-C  ?   ? ?Allergies    ?Patient has no known allergies.   ? ?Review of Systems   ?Review of Systems  ?Constitutional:  Negative for fever.  ?HENT:  Negative for ear pain.   ?Eyes:  Positive for pain and discharge.  ?Respiratory:  Negative for cough.   ?Cardiovascular:  Negative for chest pain.  ?Gastrointestinal:  Negative for abdominal pain.  ?Genitourinary:  Negative for flank pain.  ?Musculoskeletal:  Negative for back pain.  ?Skin:  Negative for rash.  ?Neurological:  Negative for headaches.  ? ?Physical Exam ?Updated Vital Signs ?BP  128/90   Pulse 83   Temp 98.6 ?F (37 ?C)   Resp 16   SpO2 100%  ?Physical Exam ?Constitutional:   ?   General: She is not in acute distress. ?   Appearance: Normal appearance.  ?HENT:  ?   Head: Normocephalic.  ?   Nose: Nose normal.  ?Eyes:  ?   Extraocular Movements: Extraocular movements intact.  ?   Conjunctiva/sclera: Conjunctivae normal.  ?   Pupils: Pupils are equal, round, and reactive to light.  ?   Comments: Patient is positive some pain with extraocular motion in the left eye.  Otherwise pupils are clear bilaterally no conjunctivitis noted.  Scant mucus discharge noted in the left eye.  Mild swelling of the left eyelid noted.  ?Cardiovascular:  ?   Rate and Rhythm: Normal rate.  ?Pulmonary:  ?   Effort: Pulmonary effort is normal.  ?Musculoskeletal:     ?   General: Normal range of motion.  ?   Cervical back: Normal range of motion.  ?Neurological:  ?   General: No focal deficit present.  ?   Mental Status: She is alert. Mental status is at baseline.  ? ? ?ED Results / Procedures / Treatments   ?Labs ?(all labs ordered are listed, but only abnormal results are displayed) ?Labs Reviewed  ?BASIC METABOLIC PANEL - Abnormal; Notable for the following components:  ?    Result Value  ? BUN <  5 (*)   ? All other components within normal limits  ?CBC WITH DIFFERENTIAL/PLATELET  ? ? ?EKG ?None ? ?Radiology ?CT Orbits W Contrast ? ?Result Date: 11/30/2021 ?CLINICAL DATA:  Left eye infection for 1 week, painful and swollen EXAM: CT ORBITS WITH CONTRAST TECHNIQUE: Multidetector CT images was performed according to the standard protocol following intravenous contrast administration. RADIATION DOSE REDUCTION: This exam was performed according to the departmental dose-optimization program which includes automated exposure control, adjustment of the mA and/or kV according to patient size and/or use of iterative reconstruction technique. CONTRAST:  OMNIPAQUE IOHEXOL 300 MG/ML  SOLN COMPARISON:  No pertinent prior  exam. FINDINGS: Orbits: Right: The globe is intact. The extraocular muscles are normal. The optic nerve is normal. The orbital fat is preserved. Left: The globe is intact. The extraocular muscles are normal. The optic nerve is normal. The orbital fat is preserved. There is mild asymmetric thickening and enhancement of the left eyelid without significant surrounding soft tissue swelling. Visible paranasal sinuses: There is mucosal thickening in the paranasal sinuses with layering fluid in the left maxillary sinus. Soft tissues: There is no organized or drainable fluid collection. There is no significant periorbital soft tissue swelling bilaterally. Osseous: There is no acute osseous abnormality or aggressive osseous lesion. Limited intracranial: None. IMPRESSION: 1. Mild thickening and enhancement of the left eyelid. Otherwise, no significant periorbital soft tissue swelling, and no evidence of orbital cellulitis. 2. Layering fluid in the left maxillary sinus which can be seen with acute sinusitis in the correct clinical setting. Electronically Signed   By: Lesia Hausen M.D.   On: 11/30/2021 13:30   ? ?Procedures ?Procedures  ? ? ?Medications Ordered in ED ?Medications  ?iohexol (OMNIPAQUE) 300 MG/ML solution 100 mL (100 mLs Intravenous Contrast Given 11/30/21 1308)  ? ? ?ED Course/ Medical Decision Making/ A&P ?  ?                        ?Medical Decision Making ?Amount and/or Complexity of Data Reviewed ?Labs: ordered. ?Radiology: ordered. ? ?Risk ?Prescription drug management. ? ? ?Review of records shows a video visit for discharge left eye today with primary care physician. ? ?This patient presents to the ED for concern of eye pain eye discharge this involves an extensive number of treatment options, and is a complaint that carries with it a high risk of complications and morbidity.  The differential diagnosis includes orbital cellulitis, acute angle glaucoma, hyphema, globe rupture. ?  ?  ?Co morbidities that  complicate the patient evaluation ?  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 ?  ? ?  ?Lab Tests: ?  ?I Ordered, and personally interpreted labs.  The pertinent results include: CBC within normal limits chemistry normal as well. ?  ?  ?Imaging Studies ordered: ?  ?CT orbits performed with no acute findings per radiology. ?  ?  ?Cardiac Monitoring: ?  ?Sinus rhythm, no ST elevations normal rate. ? ?Labs unremarkable.  CT imaging shows no evidence of acute orbital cellulitis.  She has mild swelling of the left eyelid.  Conjunctival otherwise clear I doubt bacterial conjunctivitis..  She has no pain at rest.  I doubt acute angle glaucoma. ? ?Phone number provided to the ophthalmologist on-call.  Advised patient to call make an appointment, advised follow-up within the next 3 days.  Advising immediate return for worsening symptoms fevers pain or any additional concerns. ? ? ? ? ? ? ? ? ?Final Clinical  Impression(s) / ED Diagnoses ?Final diagnoses:  ?Periorbital cellulitis of left eye  ? ? ?Rx / DC Orders ?ED Discharge Orders   ? ?      Ordered  ?  cephALEXin (KEFLEX) 500 MG capsule  3 times daily       ? 11/30/21 1349  ? ?  ?  ? ?  ? ? ?  ?Cheryll Cockayne, MD ?11/30/21 1349 ? ?

## 2021-12-08 ENCOUNTER — Encounter (HOSPITAL_BASED_OUTPATIENT_CLINIC_OR_DEPARTMENT_OTHER): Payer: Self-pay | Admitting: Emergency Medicine

## 2021-12-08 ENCOUNTER — Emergency Department (HOSPITAL_BASED_OUTPATIENT_CLINIC_OR_DEPARTMENT_OTHER)
Admission: EM | Admit: 2021-12-08 | Discharge: 2021-12-08 | Disposition: A | Discharge status: Home / Self Care | Payer: Commercial Managed Care - HMO | Attending: Emergency Medicine | Admitting: Emergency Medicine

## 2021-12-08 ENCOUNTER — Other Ambulatory Visit: Payer: Self-pay

## 2021-12-08 DIAGNOSIS — R0981 Nasal congestion: Secondary | ICD-10-CM | POA: Diagnosis present

## 2021-12-08 DIAGNOSIS — Z20822 Contact with and (suspected) exposure to covid-19: Secondary | ICD-10-CM | POA: Insufficient documentation

## 2021-12-08 DIAGNOSIS — J069 Acute upper respiratory infection, unspecified: Secondary | ICD-10-CM | POA: Diagnosis not present

## 2021-12-08 LAB — RESP PANEL BY RT-PCR (FLU A&B, COVID) ARPGX2
Influenza A by PCR: NEGATIVE
Influenza B by PCR: NEGATIVE
SARS Coronavirus 2 by RT PCR: NEGATIVE

## 2021-12-08 LAB — GROUP A STREP BY PCR: Group A Strep by PCR: NOT DETECTED

## 2021-12-08 MED ORDER — DEXAMETHASONE 4 MG PO TABS
10.0000 mg | ORAL_TABLET | Freq: Once | ORAL | Status: AC
  Administered 2021-12-08: 10 mg via ORAL
  Filled 2021-12-08: qty 3

## 2021-12-08 NOTE — ED Triage Notes (Signed)
Pt via pov from home with URI symptoms since yesterday. Pt took covid test at home today with negative result. Pt alert & oriented, nad noted.  ?

## 2021-12-08 NOTE — ED Provider Notes (Signed)
?[facility] ?Provider Note ? ? ?CSN: [id number] ?Arrival date & time: 12/08/21  1114 ? ?  ? ?History ? ?Chief Complaint  ?Patient presents with  ? URI  ? ? ?Christy Maldonado is a 26 y.o. female. ? ?The history is provided by the patient.  ?URI ?Presenting symptoms: congestion   ?Severity:  Mild ?Onset quality:  Sudden ?Duration:  1 day ?Timing:  Constant ?Progression:  Unchanged ?Chronicity:  New ?Relieved by:  Nothing ?Worsened by:  Nothing ?Associated symptoms: sinus pain   ?Associated symptoms: no arthralgias, no headaches, no myalgias, no neck pain, no sneezing, no swollen glands and no wheezing   ?Risk factors: no sick contacts   ? ?  ? ?Home Medications ?Prior to Admission medications   ?Medication Sig Start Date End Date Taking? Authorizing Provider  ?Multiple Vitamin (MULTIVITAMIN WITH MINERALS) TABS tablet Take 1 tablet by mouth daily.    [provider]  ?omeprazole (PRILOSEC) 20 MG capsule Take 1 capsule (20 mg total) by mouth daily. 11/13/21   Rema Fendt, NP  ?trimethoprim-polymyxin b (POLYTRIM) ophthalmic solution Apply 1-2 drops into affected eye QID x 5 days. 11/27/21   Waldon Merl, PA-C  ?   ? ?Allergies    ?Patient has no known allergies.   ? ?Review of Systems   ?Review of Systems  ?HENT:  Positive for congestion and sinus pain. Negative for sneezing.   ?Respiratory:  Negative for wheezing.   ?Musculoskeletal:  Negative for arthralgias, myalgias and neck pain.  ?Neurological:  Negative for headaches.  ? ?Physical Exam ?Updated Vital Signs ?BP (!) 141/96 (BP Location: Right Arm)   Pulse 89   Temp 98.3 ?F (36.8 ?C) (Oral)   Resp 18   Ht 5\' 5"  (1.651 m)   Wt 51.7 kg   LMP 06/30/2021 Comment: depo; bleeds continually  SpO2 100%   BMI 18.97 kg/m?  ?Physical Exam ?Vitals and nursing note reviewed.  ?Constitutional:   ?   General: She is not in acute distress. ?   Appearance: She is well-developed.  ?HENT:  ?   Head: Normocephalic and  atraumatic.  ?   Nose: Congestion present.  ?   Mouth/Throat:  ?   Pharynx: No oropharyngeal exudate or posterior oropharyngeal erythema.  ?Eyes:  ?   Extraocular Movements: Extraocular movements intact.  ?   Conjunctiva/sclera: Conjunctivae normal.  ?   Pupils: Pupils are equal, round, and reactive to light.  ?Cardiovascular:  ?   Rate and Rhythm: Normal rate and regular rhythm.  ?   Heart sounds: No murmur heard. ?Pulmonary:  ?   Effort: Pulmonary effort is normal. No respiratory distress.  ?   Breath sounds: Normal breath sounds.  ?Abdominal:  ?   Palpations: Abdomen is soft.  ?   Tenderness: There is no abdominal tenderness.  ?Musculoskeletal:     ?   General: No swelling.  ?   Cervical back: Normal range of motion and neck supple.  ?Lymphadenopathy:  ?   Cervical: No cervical adenopathy.  ?Skin: ?   General: Skin is warm and dry.  ?   Capillary Refill: Capillary refill takes less than 2 seconds.  ?Neurological:  ?   Mental Status: She is alert.  ?Psychiatric:     ?   Mood and Affect: Mood normal.  ? ? ?ED Results / Procedures / Treatments   ?Labs ?(all labs ordered are listed, but only abnormal results are displayed) ?Labs Reviewed  ?RESP PANEL BY RT-PCR (FLU  A&B, COVID) ARPGX2  ?GROUP A STREP BY PCR  ? ? ?EKG ?None ? ?Radiology ?No results found. ? ?Procedures ?Procedures  ? ? ?Medications Ordered in ED ?Medications  ?dexamethasone (DECADRON) tablet 10 mg (has no administration in time range)  ? ? ?ED Course/ Medical Decision Making/ A&P ?  ?                        ?Medical Decision Making ?Risk ?Prescription drug management. ? ? ?Christy Maldonado is here with nasal congestion.  Normal vitals.  No fever.  Differential diagnosis includes viral process versus allergic process.  Will treat with Decadron.  She denies pregnancy.  COVID, flu, strep test ordered.  We will call in antibiotic if strep test is positive.  No signs of throat infection on exam.  Discharge. ? ?This chart was dictated using voice  recognition software.  Despite best efforts to proofread,  errors can occur which can change the documentation meaning.  ? ? ? ? ? ? ? ?Final Clinical Impression(s) / ED Diagnoses ?Final diagnoses:  ?Upper respiratory tract infection, unspecified type  ? ? ?Rx / DC Orders ?ED Discharge Orders   ? ? None  ? ?  ? ? ?  ?Virgina Norfolk, DO ?12/08/21 1138 ? ?

## 2021-12-08 NOTE — Discharge Instructions (Signed)
Suspect viral process versus allergies.  You have been treated with a long-acting steroid.  Follow-up your strep test on MyChart.  If positive I will call in antibiotic to your pharmacy. ?

## 2021-12-10 ENCOUNTER — Ambulatory Visit: Payer: Managed Care, Other (non HMO)

## 2021-12-12 ENCOUNTER — Ambulatory Visit: Payer: Managed Care, Other (non HMO)

## 2021-12-17 ENCOUNTER — Ambulatory Visit: Payer: Managed Care, Other (non HMO)

## 2021-12-20 ENCOUNTER — Emergency Department (HOSPITAL_BASED_OUTPATIENT_CLINIC_OR_DEPARTMENT_OTHER)
Admission: EM | Admit: 2021-12-20 | Discharge: 2021-12-20 | Disposition: A | Discharge status: Home / Self Care | Payer: Managed Care, Other (non HMO) | Attending: Emergency Medicine | Admitting: Emergency Medicine

## 2021-12-20 ENCOUNTER — Other Ambulatory Visit (HOSPITAL_BASED_OUTPATIENT_CLINIC_OR_DEPARTMENT_OTHER): Payer: Self-pay

## 2021-12-20 ENCOUNTER — Other Ambulatory Visit: Payer: Self-pay

## 2021-12-20 ENCOUNTER — Encounter (HOSPITAL_BASED_OUTPATIENT_CLINIC_OR_DEPARTMENT_OTHER): Payer: Self-pay | Admitting: Obstetrics and Gynecology

## 2021-12-20 DIAGNOSIS — K068 Other specified disorders of gingiva and edentulous alveolar ridge: Secondary | ICD-10-CM | POA: Diagnosis not present

## 2021-12-20 DIAGNOSIS — K0889 Other specified disorders of teeth and supporting structures: Secondary | ICD-10-CM | POA: Diagnosis present

## 2021-12-20 DIAGNOSIS — R220 Localized swelling, mass and lump, head: Secondary | ICD-10-CM

## 2021-12-20 LAB — PREGNANCY, URINE: Preg Test, Ur: NEGATIVE

## 2021-12-20 MED ORDER — TRAMADOL HCL 50 MG PO TABS
50.0000 mg | ORAL_TABLET | Freq: Two times a day (BID) | ORAL | 0 refills | Status: DC | PRN
Start: 2021-12-20 — End: 2021-12-25
  Filled 2021-12-20: qty 5, 3d supply, fill #0

## 2021-12-20 MED ORDER — IBUPROFEN 600 MG PO TABS
600.0000 mg | ORAL_TABLET | Freq: Four times a day (QID) | ORAL | 0 refills | Status: DC | PRN
  Filled 2021-12-20: qty 30, 8d supply, fill #0

## 2021-12-20 MED ORDER — AMOXICILLIN-POT CLAVULANATE 875-125 MG PO TABS
1.0000 | ORAL_TABLET | Freq: Two times a day (BID) | ORAL | 0 refills | Status: DC
  Filled 2021-12-20: qty 14, 7d supply, fill #0

## 2021-12-20 NOTE — ED Triage Notes (Signed)
Patient reports to the ER for gum pain. She reports she bit into her gum and is having increasing pain with chewing and eating.  ?

## 2021-12-20 NOTE — Discharge Instructions (Signed)
You have been seen and discharged from the emergency department.  Need to follow-up with a dentist to ensure resolution of symptoms.  Also to rule out dental infection/cavity. If you have any worsening symptoms or further concerns for your health please return to an emergency department for further evaluation. ?

## 2021-12-20 NOTE — ED Provider Notes (Signed)
?[facility] ?Provider Note ? ? ?CSN: [id number] ?Arrival date & time: 12/20/21  9678 ? ?  ? ?History ? ?Chief Complaint  ?Patient presents with  ? Dental Pain  ? ? ?Christy Maldonado is a 26 y.o. female. ? ?HPI ? ?26 year old female presents emergency department with right lower dental pain.  Patient states yesterday while biting down she had a sharp pain in her right lower molar.  She has not seen a dentist in a while, she may have a cavity cracking sensation but since then has been having right gum/cheek swelling.  Denies any fevers, reports mild swelling of the side of her face.  No throat swelling, difficulty swallowing, difficulty eating.  No fevers at home. ? ?Home Medications ?Prior to Admission medications   ?Medication Sig Start Date End Date Taking? Authorizing Provider  ?amoxicillin-clavulanate (AUGMENTIN) 875-125 MG tablet Take 1 tablet by mouth every 12 (twelve) hours. 12/20/21  Yes Cristol Engdahl, Clabe Seal, DO  ?ibuprofen (ADVIL) 600 MG tablet Take 1 tablet (600 mg total) by mouth every 6 (six) hours as needed. 12/20/21  Yes Jodel Mayhall, Clabe Seal, DO  ?traMADol (ULTRAM) 50 MG tablet Take 1 tablet (50 mg total) by mouth every 12 (twelve) hours as needed. 12/20/21  Yes Breia Ocampo, Clabe Seal, DO  ?Multiple Vitamin (MULTIVITAMIN WITH MINERALS) TABS tablet Take 1 tablet by mouth daily.    [provider]  ?omeprazole (PRILOSEC) 20 MG capsule Take 1 capsule (20 mg total) by mouth daily. 11/13/21   Rema Fendt, NP  ?trimethoprim-polymyxin b (POLYTRIM) ophthalmic solution Apply 1-2 drops into affected eye QID x 5 days. 11/27/21   Waldon Merl, PA-C  ?   ? ?Allergies    ?Patient has no known allergies.   ? ?Review of Systems   ?Review of Systems  ?Constitutional:  Negative for fever.  ?HENT:  Positive for dental problem. Negative for drooling, trouble swallowing and voice change.   ?Respiratory:  Negative for shortness of breath.   ?Cardiovascular:  Negative for chest pain.   ?Gastrointestinal:  Negative for abdominal pain, diarrhea and vomiting.  ?Skin:  Negative for rash.  ?Neurological:  Negative for headaches.  ? ?Physical Exam ?Updated Vital Signs ?BP 132/88 (BP Location: Right Arm)   Pulse (!) 107   Temp 98.4 ?F (36.9 ?C)   Resp 16   Ht 5\' 5"  (1.651 m)   Wt 51.7 kg   LMP 06/30/2021 (Exact Date) Comment: Bleeding since october on Dep[place] shot  SpO2 99%   BMI 18.97 kg/m?  ?Physical Exam ?Vitals and nursing note reviewed.  ?Constitutional:   ?   Appearance: Normal appearance.  ?HENT:  ?   Head: Normocephalic.  ?   Mouth/Throat:  ?   Mouth: Mucous membranes are moist.  ?   Comments: Dental caries in the lower molars, mild gum swelling without any obvious cracks to, right cheek is mildly edematous and tender without focal/palpated abscess, no submandibular swelling, no throat swelling, no difficulty talking/swallowing or controlling secretions ?Cardiovascular:  ?   Rate and Rhythm: Normal rate.  ?Pulmonary:  ?   Effort: Pulmonary effort is normal. No respiratory distress.  ?Abdominal:  ?   Palpations: Abdomen is soft.  ?   Tenderness: There is no abdominal tenderness.  ?Skin: ?   General: Skin is warm.  ?Neurological:  ?   Mental Status: She is alert and oriented to person, place, and time. Mental status is at baseline.  ?Psychiatric:     ?   Mood  and Affect: Mood normal.  ? ? ?ED Results / Procedures / Treatments   ?Labs ?(all labs ordered are listed, but only abnormal results are displayed) ?Labs Reviewed  ?PREGNANCY, URINE  ? ? ?EKG ?None ? ?Radiology ?No results found. ? ?Procedures ?Procedures  ? ? ?Medications Ordered in ED ?Medications - No data to display ? ?ED Course/ Medical Decision Making/ A&P ?  ?                        ?Medical Decision Making ?Amount and/or Complexity of Data Reviewed ?Labs: ordered. ? ?Risk ?Prescription drug management. ? ? ?26 year old female presents emergency department for right lower molar pain.  Patient states that this started after she  was chewing a bit down hard on the right lower tooth.  She has extensive dental caries.  Mild associated gum swelling and buccal swelling without any submandibular involvement or throat/neck involvement.  She is afebrile, was tachycardic in triage, normal heart rate on my evaluation.  No difficulty speaking/swallowing. ? ?We will treat her preemptively for dental caries/abscess with antibiotic and pain control.  Will refer outpatient to dental clinic with strict return to ED precautions.  Patient at this time appears safe and stable for discharge and close outpatient follow up. Discharge plan and strict return to ED precautions discussed, patient verbalizes understanding and agreement. ? ? ? ? ? ? ? ?Final Clinical Impression(s) / ED Diagnoses ?Final diagnoses:  ?Swelling of gums  ? ? ?Rx / DC Orders ?ED Discharge Orders   ? ?      Ordered  ?  amoxicillin-clavulanate (AUGMENTIN) 875-125 MG tablet  Every 12 hours       ? 12/20/21 0957  ?  ibuprofen (ADVIL) 600 MG tablet  Every 6 hours PRN       ? 12/20/21 0957  ?  traMADol (ULTRAM) 50 MG tablet  Every 12 hours PRN       ? 12/20/21 0957  ? ?  ?  ? ?  ? ? ?  ?Rozelle Logan, DO ?12/20/21 1013 ? ?

## 2021-12-25 ENCOUNTER — Other Ambulatory Visit: Payer: Self-pay

## 2021-12-25 ENCOUNTER — Telehealth: Payer: Managed Care, Other (non HMO) | Admitting: Physician Assistant

## 2021-12-25 ENCOUNTER — Encounter: Payer: Self-pay | Admitting: Physician Assistant

## 2021-12-25 ENCOUNTER — Encounter (HOSPITAL_COMMUNITY): Payer: Self-pay

## 2021-12-25 ENCOUNTER — Ambulatory Visit: Payer: Self-pay

## 2021-12-25 ENCOUNTER — Ambulatory Visit (HOSPITAL_COMMUNITY)
Admission: RE | Admit: 2021-12-25 | Discharge: 2021-12-25 | Disposition: A | Discharge status: Home / Self Care | Payer: Managed Care, Other (non HMO) | Source: Ambulatory Visit

## 2021-12-25 VITALS — BP 129/87 | HR 111 | Temp 97.9°F | Resp 19

## 2021-12-25 DIAGNOSIS — R1013 Epigastric pain: Secondary | ICD-10-CM | POA: Diagnosis not present

## 2021-12-25 DIAGNOSIS — R1033 Periumbilical pain: Secondary | ICD-10-CM

## 2021-12-25 MED ORDER — ALUM & MAG HYDROXIDE-SIMETH 200-200-20 MG/5ML PO SUSP
30.0000 mL | Freq: Once | ORAL | Status: AC
  Administered 2021-12-25: 30 mL via ORAL

## 2021-12-25 MED ORDER — ALUM & MAG HYDROXIDE-SIMETH 200-200-20 MG/5ML PO SUSP
ORAL | Status: AC
  Filled 2021-12-25: qty 30

## 2021-12-25 MED ORDER — LIDOCAINE VISCOUS HCL 2 % MT SOLN
OROMUCOSAL | Status: AC
  Filled 2021-12-25: qty 15

## 2021-12-25 MED ORDER — ALUMINUM-MAGNESIUM-SIMETHICONE 200-200-20 MG/5ML PO SUSP: 30.0000 mL | Freq: Three times a day (TID) | ORAL | 0 refills | Status: DC

## 2021-12-25 MED ORDER — LIDOCAINE VISCOUS HCL 2 % MT SOLN
15.0000 mL | Freq: Once | OROMUCOSAL | Status: AC
  Administered 2021-12-25: 15 mL via ORAL

## 2021-12-25 NOTE — Progress Notes (Signed)
?Virtual Visit Consent  ? ?Christy Maldonado, you are scheduled for a virtual visit with a [facility] provider today.   ?  ?Just as with appointments in the office, your consent must be obtained to participate.  Your consent will be active for this visit and any virtual visit you may have with one of our providers in the next 365 days.   ?  ?If you have a MyChart account, a copy of this consent can be sent to you electronically.  All virtual visits are billed to your insurance company just like a traditional visit in the office.   ? ?As this is a virtual visit, video technology does not allow for your provider to perform a traditional examination.  This may limit your provider's ability to fully assess your condition.  If your provider identifies any concerns that need to be evaluated in person or the need to arrange testing (such as labs, EKG, etc.), we will make arrangements to do so.   ?  ?Although advances in technology are sophisticated, we cannot ensure that it will always work on either your end or our end.  If the connection with a video visit is poor, the visit may have to be switched to a telephone visit.  With either a video or telephone visit, we are not always able to ensure that we have a secure connection.    ? ?I need to obtain your verbal consent now.   Are you willing to proceed with your visit today?  ?  ?[name] has provided verbal consent on 12/25/2021 for a virtual visit (video or telephone). ?  ?Piedad Climes, PA-C  ? ?Date: 12/25/2021 10:57 AM ? ? ?Virtual Visit via Video Note  ? ?I, Piedad Climes, connected with  Christy Maldonado  ([id number], [birth year]) on 12/25/21 at 10:45 AM EDT by a video-enabled telemedicine application and verified that I am speaking with the correct person using two identifiers. ? ?Location: ?Patient: Virtual Visit Location Patient: Home ?Provider: Virtual Visit Location Provider: Home Office ?  ?I discussed the  limitations of evaluation and management by telemedicine and the availability of in person appointments. The patient expressed understanding and agreed to proceed.   ? ?History of Present Illness: ?Christy Maldonado is a 26 y.o. who identifies as a female who was assigned female at birth, and is being seen today for 3 days of a weird sensation in her stomach -- described as cramping initially but now with some sharper pains moving to her back. Took a pregnancy test which was negative.  Notes she cannot get comfortable and affecting sleep. Denies fever, chills. Notes nausea without vomiting. Denies any change to bowel habits and notes she is always regular. Denies melena, hematochezia or tenesmus. Some increased urinary frequency without dysuria. Is spotting now but has been ongoing issue since starting Depo shots. Denies any vaginal pain, pressure, discharge. .  ? ? ?HPI: HPI  ?Problems:  ?Patient Active Problem List  ? Diagnosis Date Noted  ? Anxiety state 11/02/2020  ? Constipation 11/02/2020  ? 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 Dyspepsia 11/02/2020  ? Tachycardia 11/02/2020  ? Panic attacks 09/30/2016  ?  ?Allergies: No Known Allergies ?Medications:  ?Current Outpatient Medications:  ?  amoxicillin-clavulanate (AUGMENTIN) 875-125 MG tablet, Take 1 tablet by mouth every 12 (twelve) hours., Disp: 14 tablet, Rfl: 0 ?  ibuprofen (ADVIL) 600 MG tablet, Take 1 tablet (600 mg total) by mouth every 6 (six) hours as needed., Disp:  30 tablet, Rfl: 0 ?  Multiple Vitamin (MULTIVITAMIN WITH MINERALS) TABS tablet, Take 1 tablet by mouth daily., Disp: , Rfl:  ?  omeprazole (PRILOSEC) 20 MG capsule, Take 1 capsule (20 mg total) by mouth daily., Disp: 90 capsule, Rfl: 0 ?  traMADol (ULTRAM) 50 MG tablet, Take 1 tablet (50 mg total) by mouth every 12 (twelve) hours as needed., Disp: 5 tablet, Rfl: 0 ? ?Observations/Objective: ?Patient is well-developed, well-nourished in no acute distress.  ?Resting comfortably  at home.  ?Head is normocephalic, atraumatic.  ?No labored breathing. ?Speech is clear and coherent with logical content.  ?Patient is alert and oriented at baseline.  ? ?Assessment and Plan: ?1. Periumbilical abdominal pain ? ?Mostly periumbilical and radiating to mid back now. Afebrile. She does not seem to be in any distress warranting EMS. Want her to reach out to PCP to see if they can see her this morning as she needs examination, labs and potential imaging if indicated by exam. UC/ER if they are unable to see her. ? ?No charge for visit as she needs a higher level of care ? ?Follow Up Instructions: ?I discussed the assessment and treatment plan with the patient. The patient was provided an opportunity to ask questions and all were answered. The patient agreed with the plan and demonstrated an understanding of the instructions.  A copy of instructions were sent to the patient via MyChart unless otherwise noted below.  ? ?The patient was advised to call back or seek an in-person evaluation if the symptoms worsen or if the condition fails to improve as anticipated. ? ?Time:  ?I spent 12 minutes with the patient via telehealth technology discussing the above problems/concerns.   ? ?Piedad Climes, PA-C ?

## 2021-12-25 NOTE — Telephone Encounter (Signed)
?  Chief Complaint: abdominal pain ?Symptoms: constant abdominal pain near belly button and sharp pain in back that comes and goes ?Frequency: 3 days ?Pertinent Negatives: Patient denies vomiting or diarrhea ?Disposition: [] ED /[x] Urgent Care (no appt availability in office) / [] Appointment(In office/virtual)/ []  [facility]/ [] Home Care/ [] Refused Recommended Disposition /[] [facility]/ []  Follow-up with PCP ?Additional Notes:  pt had VUC visit this morning and was advised to be seen in person d/t symptoms. No appts available. Scheduled app[facility] at [facility] today @ 1300.  ? ? ?Summary: stomach pains,very sharp pain to her lower back  ? Pt stated experiencing stomach pains starting at her belly button and very sharp pain to her lower back that comes and goes has been dealing with this for about 3 days.  ? ?Seeking clinical advice.  ? ?No appointments available.   ?  ? ?Reason for Disposition ? [1] MILD-MODERATE pain AND [2] constant AND [3] present > 2 hours ? ?Answer Assessment - Initial Assessment Questions ?1. LOCATION: "Where does it hurt?"  ?    Belly button ?2. RADIATION: "Does the pain shoot anywhere else?" (e.g., chest, back) ?    Sharp pain in back ?3. ONSET: "When did the pain begin?" (e.g., minutes, hours or days ago)  ?    3 days ago ?5. PATTERN "Does the pain come and go, or is it constant?" ?   - If constant: "Is it getting better, staying the same, or worsening?"  ?    (Note: Constant means the pain never goes away completely; most serious pain is constant and it progresses)  ?   - If intermittent: "How long does it last?" "Do you have pain now?" ?    (Note: Intermittent means the pain goes away completely between bouts) ?    Constant  ?6. SEVERITY: "How bad is the pain?"  (e.g., Scale 1-10; mild, moderate, or severe) ?  - MILD (1-3): doesn't interfere with normal activities, abdomen soft and not tender to touch  ?  - MODERATE (4-7): interferes with normal activities or awakens from  sleep, abdomen tender to touch  ?  - SEVERE (8-10): excruciating pain, doubled over, unable to do any normal activities  ?    7 ?9. RELIEVING/AGGRAVATING FACTORS: "What makes it better or worse?" (e.g., movement, antacids, bowel movement) ?    No ?10. OTHER SYMPTOMS: "Do you have any other symptoms?" (e.g., back pain, diarrhea, fever, urination pain, vomiting) ?      nausea ? ?Protocols used: Abdominal Pain - Female-A-AH ? ?

## 2021-12-25 NOTE — Patient Instructions (Signed)
?  Christy Maldonado, thank you for joining Piedad Climes, PA-C for today's virtual visit.  While this provider is not your primary care provider (PCP), if your PCP is located in our provider database this encounter information will be shared with them immediately following your visit. ? ?Consent: ?(Patient) Christy Maldonado provided verbal consent for this virtual visit at the beginning of the encounter. ? ?Current Medications: ? ?Current Outpatient Medications:  ?  amoxicillin-clavulanate (AUGMENTIN) 875-125 MG tablet, Take 1 tablet by mouth every 12 (twelve) hours., Disp: 14 tablet, Rfl: 0 ?  ibuprofen (ADVIL) 600 MG tablet, Take 1 tablet (600 mg total) by mouth every 6 (six) hours as needed., Disp: 30 tablet, Rfl: 0 ?  Multiple Vitamin (MULTIVITAMIN WITH MINERALS) TABS tablet, Take 1 tablet by mouth daily., Disp: , Rfl:  ?  omeprazole (PRILOSEC) 20 MG capsule, Take 1 capsule (20 mg total) by mouth daily., Disp: 90 capsule, Rfl: 0 ?  traMADol (ULTRAM) 50 MG tablet, Take 1 tablet (50 mg total) by mouth every 12 (twelve) hours as needed., Disp: 5 tablet, Rfl: 0  ? ?Medications ordered in this encounter:  ?No orders of the defined types were placed in this encounter. ?  ? ?*If you need refills on other medications prior to your next appointment, please contact your pharmacy* ? ?Follow-Up: ?Call back or seek an in-person evaluation if the symptoms worsen or if the condition fails to improve as anticipated. ? ?Other Instructions ? ? ?If you have been instructed to have an in-person evaluation today at a local Urgent Care facility, please use the link below. It will take you to a list of all of our available [facility], including address, phone number and hours of operation. Please do not delay care.  ?[facility] ? ?If you or a family member do not have a primary care provider, use the link below to schedule a visit and establish care. When you choose a [facility] primary care physician or advanced practice provider, you gain a long-term partner in health. ?Find a Primary Care Provider ? ?Learn more about [facility]'s in-office and virtual care options: ?[facility] - Get Care Now  ?

## 2021-12-25 NOTE — ED Provider Notes (Signed)
?[facility] ? ? ? ?CSN: [id number] ?Arrival date & time: 12/25/21  1244 ? ? ?  ? ?History   ?Chief Complaint ?Chief Complaint  ?Patient presents with  ? Abdominal Pain  ?  3 days, had tele visit this morning and recommended being seen in person. - Entered by patient  ? ? ?HPI ?Christy Maldonado is a 26 y.o. female.  ? ?Patient presents with constant periumbilical pain radiating to the right and left side of her back.  Pain is described as sharp rating a 10 out of 10.  Associated nausea without vomiting and bloating.  Last bowel movement today, endorses that she goes daily.  Denies heartburn or indigestion, increased gas production, fever, chills, URI symptoms, recent travel, changes in diet, consumption of raw foods, urinary or vaginal changes.  Home pregnancy test negative, currently taking Depo.  Has not attempted treatment of symptoms.  Completed a video visit today where it was recommended that she was seen in person. ? ? ?Past Medical History:  ?Diagnosis Date  ? Anxiety   ? [employer] 11/28/2020  ? Depression   ? [employer] 11/28/2020  ? ? ?Patient Active Problem List  ? Diagnosis Date Noted  ? Anxiety state 11/02/2020  ? Constipation 11/02/2020  ? 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 Dyspepsia 11/02/2020  ? Tachycardia 11/02/2020  ? Panic attacks 09/30/2016  ? ? ?Past Surgical History:  ?Procedure Laterality Date  ? ARTHROSCOPIC REPAIR ACL  2016  ? EYE SURGERY N/A   ? [employer] 11/28/2020  ? ? ?OB History   ? ? Gravida  ?0  ? Para  ?0  ? Term  ?0  ? Preterm  ?0  ? AB  ?0  ? Living  ?0  ?  ? ? SAB  ?0  ? IAB  ?0  ? Ectopic  ?0  ? Multiple  ?0  ? Live Births  ?0  ?   ?  ?  ? ? ? ?Home Medications   ? ?Prior to Admission medications   ?Medication Sig Start Date End Date Taking? Authorizing Provider  ?hydrOXYzine (ATARAX) 50 MG tablet Take 50 mg by mouth 3 (three) times daily as needed.   Yes [provider]  ?amoxicillin-clavulanate (AUGMENTIN) 875-125 MG tablet Take 1 tablet by  mouth every 12 (twelve) hours. 12/20/21   Horton, Clabe Seal, DO  ?ibuprofen (ADVIL) 600 MG tablet Take 1 tablet (600 mg total) by mouth every 6 (six) hours as needed. 12/20/21   Horton, Clabe Seal, DO  ?Multiple Vitamin (MULTIVITAMIN WITH MINERALS) TABS tablet Take 1 tablet by mouth daily.    [provider]  ?omeprazole (PRILOSEC) 20 MG capsule Take 1 capsule (20 mg total) by mouth daily. 11/13/21   Rema Fendt, NP  ?traMADol (ULTRAM) 50 MG tablet Take 1 tablet (50 mg total) by mouth every 12 (twelve) hours as needed. 12/20/21   Horton, Clabe Seal, DO  ? ? ?Family History ?Family History  ?Problem Relation Age of Onset  ? Hypertension Mother   ? Diabetes Mother   ? Hypertension Father   ? ? ?Social History ?Social History  ? ?Tobacco Use  ? Smoking status: Former  ?  Types: Cigarettes  ?  Quit date: 2018  ?  Years since quitting: 5.2  ? Smokeless tobacco: Never  ?Vaping Use  ? Vaping Use: Every day  ?Substance Use Topics  ? Alcohol use: Not Currently  ?  Comment: occasionally  ? Drug use: Not Currently  ?  Types:  Marijuana  ? ? ? ?Allergies   ?Patient has no known allergies. ? ? ?Review of Systems ?Review of Systems  ?Gastrointestinal:  Positive for abdominal pain.  ? ? ?Physical Exam ?Triage Vital Signs ?ED Triage Vitals  ?Enc Vitals Group  ?   BP 12/25/21 1304 129/87  ?   Pulse Rate 12/25/21 1304 (!) 111  ?   Resp 12/25/21 1304 19  ?   Temp 12/25/21 1307 97.9 ?F (36.6 ?C)  ?   Temp src --   ?   SpO2 12/25/21 1304 100 %  ?   Weight --   ?   Height --   ?   Head Circumference --   ?   Peak Flow --   ?   Pain Score 12/25/21 1305 7  ?   Pain Loc --   ?   Pain Edu? --   ?   Excl. in GC? --   ? ?No data found. ? ?Updated Vital Signs ?BP 129/87   Pulse (!) 111   Temp 97.9 ?F (36.6 ?C)   Resp 19   SpO2 100%  ? ?Visual Acuity ?Right Eye Distance:   ?Left Eye Distance:   ?Bilateral Distance:   ? ?Right Eye Near:   ?Left Eye Near:    ?Bilateral Near:    ? ?Physical Exam ?Constitutional:   ?   Appearance: Normal  appearance.  ?HENT:  ?   Mouth/Throat:  ?   Mouth: Mucous membranes are moist.  ?Eyes:  ?   Extraocular Movements: Extraocular movements intact.  ?Cardiovascular:  ?   Rate and Rhythm: Normal rate and regular rhythm.  ?Pulmonary:  ?   Effort: Pulmonary effort is normal.  ?Abdominal:  ?   General: Abdomen is flat. Bowel sounds are increased.  ?   Palpations: Abdomen is soft.  ?   Tenderness: There is generalized abdominal tenderness.  ?Skin: ?   General: Skin is warm and dry.  ?Neurological:  ?   Mental Status: She is alert and oriented to person, place, and time. Mental status is at baseline.  ?Psychiatric:     ?   Mood and Affect: Mood normal.     ?   Behavior: Behavior normal.  ? ? ? ?UC Treatments / Results  ?Labs ?(all labs ordered are listed, but only abnormal results are displayed) ?Labs Reviewed - No data to display ? ?EKG ? ? ?Radiology ?No results found. ? ?Procedures ?Procedures (including critical care time) ? ?Medications Ordered in UC ?Medications - No data to display ? ?Initial Impression / Assessment and Plan / UC Course  ?I have reviewed the triage vital signs and the nursing notes. ? ?Pertinent labs & imaging results that were available during my care of the patient were reviewed by me and considered in my medical decision making (see chart for details). ? ?Epigastric abdominal pain ? ?Vital signs are stable, generalized tenderness noted on exam and increased bowel sounds over the epigastric region, however patient is in no signs of distress, will defer referral to the emergency department at this time as there is a low suspicion for an acute abdomen, etiology of symptoms are most consistent with gas, discussed with patient, Maalox with lidocaine given in office, symptoms resolved on reassessment after 15 minutes, Maalox prescribed for outpatient use, recommended a bland diet, may follow-up with urgent care as needed ?Final Clinical Impressions(s) / UC Diagnoses  ? ?Final diagnoses:  ?None   ? ?Discharge Instructions   ?None ?  ? ?  ED Prescriptions   ?None ?  ? ?PDMP not reviewed this encounter. ?  ?Valinda Hoar, NP ?12/25/21 1411 ? ?

## 2021-12-25 NOTE — Discharge Instructions (Signed)
Today believe that your symptoms were being caused by gas sitting in your stomach causing pressure ? ?In office today you are given an Maalox with lidocaine which has relieved your symptoms ? ?You have been prescribed Maalox for home use, you may use every 6 hours as needed, ideally take a dose prior to eating foods that are spicy, greasy to prevent stomach irritation ? ?You may follow-up with urgent care if needed if symptoms return ?

## 2021-12-25 NOTE — ED Triage Notes (Signed)
Pt is present today with lower abdominal pain that started this past Saturday. Pt states at she is also experiencing sharp pains in the middle of her back.  ?

## 2021-12-26 ENCOUNTER — Emergency Department (HOSPITAL_BASED_OUTPATIENT_CLINIC_OR_DEPARTMENT_OTHER): Payer: Commercial Managed Care - HMO | Admitting: Radiology

## 2021-12-26 ENCOUNTER — Encounter (HOSPITAL_BASED_OUTPATIENT_CLINIC_OR_DEPARTMENT_OTHER): Payer: Self-pay

## 2021-12-26 ENCOUNTER — Emergency Department (HOSPITAL_BASED_OUTPATIENT_CLINIC_OR_DEPARTMENT_OTHER)
Admission: EM | Admit: 2021-12-26 | Discharge: 2021-12-26 | Disposition: A | Discharge status: Home / Self Care | Payer: Commercial Managed Care - HMO | Attending: Emergency Medicine | Admitting: Emergency Medicine

## 2021-12-26 DIAGNOSIS — S8992XA Unspecified injury of left lower leg, initial encounter: Secondary | ICD-10-CM | POA: Diagnosis present

## 2021-12-26 DIAGNOSIS — X501XXA Overexertion from prolonged static or awkward postures, initial encounter: Secondary | ICD-10-CM | POA: Insufficient documentation

## 2021-12-26 DIAGNOSIS — M25562 Pain in left knee: Secondary | ICD-10-CM | POA: Diagnosis not present

## 2021-12-26 DIAGNOSIS — S83005A Unspecified dislocation of left patella, initial encounter: Secondary | ICD-10-CM | POA: Insufficient documentation

## 2021-12-26 MED ORDER — KETOROLAC TROMETHAMINE 30 MG/ML IJ SOLN
30.0000 mg | Freq: Once | INTRAMUSCULAR | Status: AC
  Administered 2021-12-26: 30 mg via INTRAMUSCULAR
  Filled 2021-12-26: qty 1

## 2021-12-26 NOTE — ED Provider Notes (Signed)
?[facility] ?Provider Note ? ? ?CSN: [id number] ?Arrival date & time: 12/26/21  8338 ? ?  ? ?History ? ?No chief complaint on file. ? ? ?Christy Maldonado is a 26 y.o. female. ? ?HPI ? ? 26 year old female with na hx of torn ACL on the right s/p repair in 2016, hx of patellar dislocations previously, who presents with knee pain. ? ?Had a patellar dislocation x2 yesterday. Hx of the same. Endorses pain and a 'popping' sensation in the left knee. Pain with extension and flexion of the left knee.  ? ?Home Medications ?Prior to Admission medications   ?Medication Sig Start Date End Date Taking? Authorizing Provider  ?aluminum-magnesium hydroxide-simethicone (MAALOX) 200-200-20 MG/5ML SUSP Take 30 mLs by mouth 4 (four) times daily -  before meals and at bedtime. 12/25/21   Valinda Hoar, NP  ?amoxicillin-clavulanate (AUGMENTIN) 875-125 MG tablet Take 1 tablet by mouth every 12 (twelve) hours. 12/20/21   Horton, Clabe Seal, DO  ?hydrOXYzine (ATARAX) 50 MG tablet Take 50 mg by mouth 3 (three) times daily as needed.    [provider]  ?ibuprofen (ADVIL) 600 MG tablet Take 1 tablet (600 mg total) by mouth every 6 (six) hours as needed. 12/20/21   Horton, Clabe Seal, DO  ?Multiple Vitamin (MULTIVITAMIN WITH MINERALS) TABS tablet Take 1 tablet by mouth daily.    [provider]  ?omeprazole (PRILOSEC) 20 MG capsule Take 1 capsule (20 mg total) by mouth daily. 11/13/21   Rema Fendt, NP  ?   ? ?Allergies    ?Patient has no known allergies.   ? ?Review of Systems   ?Review of Systems  ?Musculoskeletal:  Positive for arthralgias and joint swelling.  ?All other systems reviewed and are negative. ? ?Physical Exam ?Updated Vital Signs ?BP 125/89 (BP Location: Right Arm)   Pulse 98   Temp 98.3 ?F (36.8 ?C) (Oral)   Resp 15   SpO2 100%  ?Physical Exam ?Vitals and nursing note reviewed.  ?Constitutional:   ?   General: She is not in acute distress. ?HENT:  ?   Head:  Normocephalic and atraumatic.  ?Eyes:  ?   Conjunctiva/sclera: Conjunctivae normal.  ?   Pupils: Pupils are equal, round, and reactive to light.  ?Cardiovascular:  ?   Rate and Rhythm: Normal rate and regular rhythm.  ?   Comments: 2+ DP pulses bilaterally ?Pulmonary:  ?   Effort: Pulmonary effort is normal. No respiratory distress.  ?Abdominal:  ?   General: There is no distension.  ?   Tenderness: There is no guarding.  ?Musculoskeletal:     ?   General: Swelling and tenderness present. No deformity.  ?   Cervical back: Neck supple.  ?   Comments: TTP of the bilateral knee, pain with varus stress. Negative anterior drawer. Mild joint effusion palpated. Distal LLE NVI  ?Skin: ?   Findings: No lesion or rash.  ?Neurological:  ?   General: No focal deficit present.  ?   Mental Status: She is alert. Mental status is at baseline.  ? ? ?ED Results / Procedures / Treatments   ?Labs ?(all labs ordered are listed, but only abnormal results are displayed) ?Labs Reviewed - No data to display ? ?EKG ?None ? ?Radiology ?DG Knee Complete 4 Views Left ? ?Result Date: 12/26/2021 ?CLINICAL DATA:  History of subluxation of patella.  Pain EXAM: LEFT KNEE - COMPLETE 4+ VIEW COMPARISON:  01/11/2020 FINDINGS: No evidence of fracture, dislocation,  or joint effusion. No evidence of arthropathy or other focal bone abnormality. Soft tissues are unremarkable. Normal appearing patella. IMPRESSION: Negative. Electronically Signed   By: Marlan Palau M.D.   On: 12/26/2021 09:36   ? ?Procedures ?Procedures  ? ? ?Medications Ordered in ED ?Medications  ?ketorolac (TORADOL) 30 MG/ML injection 30 mg (30 mg Intramuscular Given 12/26/21 1000)  ? ? ?ED Course/ Medical Decision Making/ A&P ?  ?                        ?Medical Decision Making ?Amount and/or Complexity of Data Reviewed ?Radiology: ordered. ? ?Risk ?Prescription drug management. ? ? ? 26 year old female with na hx of torn ACL on the right s/p repair in 2016, hx of patellar dislocations  previously, who presents with knee pain. ? ?Had a patellar dislocation x2 yesterday. Hx of the same. Endorses pain and a 'popping' sensation in the left knee. Pain with extension and flexion of the left knee.  ? ?Physical exam with left knee tenderness to palpation bilaterally, no subluxation of the patella, appears to be located, distal left lower extremity neurovascular intact, mild joint effusion noted.  Pain with varus stress, negative anterior drawer test. ? ?X-ray imaging performed of the left knee negative for acute fracture or dislocation.  Patient was placed in a knee immobilizer, advised NSAIDs, ice, rest, elevation of the extremity.  Plan for outpatient sports medicine follow-up for repeat assessment. ? ?Final Clinical Impression(s) / ED Diagnoses ?Final diagnoses:  ?Dislocation of left patella, initial encounter  ? ? ?Rx / DC Orders ?ED Discharge Orders   ? ?      Ordered  ?  AMB referral to sports medicine       ? 12/26/21 1020  ? ?  ?  ? ?  ? ? ?  ?Ernie Avena, MD ?12/26/21 2125 ? ?

## 2021-12-26 NOTE — ED Triage Notes (Signed)
She is here with c/o left knee pain. 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 She also c/o hx of patella "dislocation". She states her left patella "dislocated twice yesterday" and continues to be painful. She is ambulatory with limp. ?

## 2021-12-26 NOTE — Discharge Instructions (Addendum)
You were evaluated in the Emergency Department and after careful evaluation, we did not find any emergent condition requiring admission or further testing in the hospital. ? ?Your exam/testing today was overall reassuring.  Patient imaging was negative for acute fracture.  We will place you in a knee immobilizer and have you follow-up with sports medicine in 1 week.  Recommend NSAIDs, rest, ice, elevation of the extremity as needed. ? ?Please return to the Emergency Department if you experience any worsening of your condition.  Thank you for allowing [place] to be a part of your care. ? ?

## 2022-01-07 ENCOUNTER — Encounter: Payer: Self-pay | Admitting: Family

## 2022-01-07 ENCOUNTER — Other Ambulatory Visit: Payer: Self-pay | Admitting: Family

## 2022-01-07 DIAGNOSIS — F411 Generalized anxiety disorder: Secondary | ICD-10-CM

## 2022-01-23 ENCOUNTER — Encounter (HOSPITAL_COMMUNITY): Payer: Self-pay | Admitting: Psychiatry

## 2022-01-23 ENCOUNTER — Telehealth: Payer: Managed Care, Other (non HMO) | Admitting: Physician Assistant

## 2022-01-23 ENCOUNTER — Ambulatory Visit (INDEPENDENT_AMBULATORY_CARE_PROVIDER_SITE_OTHER): Payer: 59 | Admitting: Psychiatry

## 2022-01-23 DIAGNOSIS — F321 Major depressive disorder, single episode, moderate: Secondary | ICD-10-CM | POA: Diagnosis not present

## 2022-01-23 DIAGNOSIS — F411 Generalized anxiety disorder: Secondary | ICD-10-CM | POA: Diagnosis not present

## 2022-01-23 DIAGNOSIS — H66002 Acute suppurative otitis media without spontaneous rupture of ear drum, left ear: Secondary | ICD-10-CM | POA: Diagnosis not present

## 2022-01-23 DIAGNOSIS — F4321 Adjustment disorder with depressed mood: Secondary | ICD-10-CM | POA: Diagnosis not present

## 2022-01-23 MED ORDER — MIRTAZAPINE 7.5 MG PO TABS: 7.5000 mg | ORAL_TABLET | Freq: Every day | ORAL | 0 refills | Status: DC

## 2022-01-23 MED ORDER — NEOMYCIN-POLYMYXIN-HC 3.5-10000-1 OT SOLN: 3.0000 [drp] | Freq: Four times a day (QID) | OTIC | 0 refills | Status: DC

## 2022-01-23 MED ORDER — AMOXICILLIN 500 MG PO CAPS: 500.0000 mg | ORAL_CAPSULE | Freq: Two times a day (BID) | ORAL | 0 refills | Status: DC

## 2022-01-23 NOTE — Progress Notes (Signed)
?Virtual Visit Consent  ? ?Christy Maldonado, you are scheduled for a virtual visit with a [facility] provider today.   ?  ?Just as with appointments in the office, your consent must be obtained to participate.  Your consent will be active for this visit and any virtual visit you may have with one of our providers in the next 365 days.   ?  ?If you have a MyChart account, a copy of this consent can be sent to you electronically.  All virtual visits are billed to your insurance company just like a traditional visit in the office.   ? ?As this is a virtual visit, video technology does not allow for your provider to perform a traditional examination.  This may limit your provider's ability to fully assess your condition.  If your provider identifies any concerns that need to be evaluated in person or the need to arrange testing (such as labs, EKG, etc.), we will make arrangements to do so.   ?  ?Although advances in technology are sophisticated, we cannot ensure that it will always work on either your end or our end.  If the connection with a video visit is poor, the visit may have to be switched to a telephone visit.  With either a video or telephone visit, we are not always able to ensure that we have a secure connection.    ? ?Also, by engaging in this virtual visit, you consent to the provision of healthcare. Additionally, you authorize for your insurance to be billed (if applicable) for the services provided during this visit.  ? ?I need to obtain your verbal consent now.   Are you willing to proceed with your visit today?  ?  ?Abree Romick has provided verbal consent on 01/23/2022 for a virtual visit (video or telephone). ?  ?Margaretann Loveless, PA-C  ? ?Date: 01/23/2022 9:25 AM ? ? ?Virtual Visit via Video Note  ? ?IMargaretann Loveless, connected with  Christy Maldonado  ([id number], Nov 27, 1995) on 01/23/22 at  9:15 AM EDT by a video-enabled telemedicine application and  verified that I am speaking with the correct person using two identifiers. ? ?Location: ?Patient: Virtual Visit Location Patient: Home ?Provider: Virtual Visit Location Provider: Home Office ?  ?I discussed the limitations of evaluation and management by telemedicine and the availability of in person appointments. The patient expressed understanding and agreed to proceed.   ? ?History of Present Illness: ?Christy Maldonado is a 26 y.o. who identifies as a female who was assigned female at birth, and is being seen today for possible ear infection. ? ?HPI: Otalgia  ?There is pain in the left ear. This is a new problem. The current episode started in the past 7 days. The problem occurs constantly. The problem has been gradually worsening. There has been no fever. The pain is moderate. Associated symptoms include headaches, hearing loss, rhinorrhea and a sore throat (radiating from ear). Pertinent negatives include no coughing, ear discharge or neck pain. She has tried NSAIDs for the symptoms. The treatment provided no relief. Her past medical history is significant for a chronic ear infection.   ? ? ?Problems:  ?Patient Active Problem List  ? Diagnosis Date Noted  ? Anxiety state 11/02/2020  ? Constipation 11/02/2020  ? 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 Dyspepsia 11/02/2020  ? Tachycardia 11/02/2020  ? Panic attacks 09/30/2016  ?  ?Allergies: No Known Allergies ?Medications:  ?Current Outpatient Medications:  ?  amoxicillin (AMOXIL) 500  MG capsule, Take 1 capsule (500 mg total) by mouth 2 (two) times daily for 10 days., Disp: 20 capsule, Rfl: 0 ?  neomycin-polymyxin-hydrocortisone (CORTISPORIN) OTIC solution, Place 3 drops into the left ear 4 (four) times daily. X 7 days, Disp: 10 mL, Rfl: 0 ?  aluminum-magnesium hydroxide-simethicone (MAALOX) 200-200-20 MG/5ML SUSP, Take 30 mLs by mouth 4 (four) times daily -  before meals and at bedtime., Disp: 1680 mL, Rfl: 0 ?  hydrOXYzine (ATARAX) 50 MG tablet,  Take 50 mg by mouth 3 (three) times daily as needed., Disp: , Rfl:  ?  ibuprofen (ADVIL) 600 MG tablet, Take 1 tablet (600 mg total) by mouth every 6 (six) hours as needed., Disp: 30 tablet, Rfl: 0 ?  Multiple Vitamin (MULTIVITAMIN WITH MINERALS) TABS tablet, Take 1 tablet by mouth daily., Disp: , Rfl:  ?  omeprazole (PRILOSEC) 20 MG capsule, Take 1 capsule (20 mg total) by mouth daily., Disp: 90 capsule, Rfl: 0 ? ?Observations/Objective: ?Patient is well-developed, well-nourished in no acute distress.  ?Resting comfortably at home.  ?Head is normocephalic, atraumatic.  ?No labored breathing.  ?Speech is clear and coherent with logical content.  ?Patient is alert and oriented at baseline.  ? ? ?Assessment and Plan: ?1. Non-recurrent acute suppurative otitis media of left ear without spontaneous rupture of tympanic membrane ?- neomycin-polymyxin-hydrocortisone (CORTISPORIN) OTIC solution; Place 3 drops into the left ear 4 (four) times daily. X 7 days  Dispense: 10 mL; Refill: 0 ?- amoxicillin (AMOXIL) 500 MG capsule; Take 1 capsule (500 mg total) by mouth 2 (two) times daily for 10 days.  Dispense: 20 capsule; Refill: 0 ? ?- Suspect Otitis Media of the left ?- Cortisporin and Amoxicillin prescribed ?- Warm compresses ?- Tylenol and Ibuprofen as needed ?- Seek in person evaluation if not improving or if symptoms worsen ? ?Follow Up Instructions: ?I discussed the assessment and treatment plan with the patient. The patient was provided an opportunity to ask questions and all were answered. The patient agreed with the plan and demonstrated an understanding of the instructions.  A copy of instructions were sent to the patient via MyChart unless otherwise noted below.  ? ? ?The patient was advised to call back or seek an in-person evaluation if the symptoms worsen or if the condition fails to improve as anticipated. ? ?Time:  ?I spent 10 minutes with the patient via telehealth technology discussing the above  problems/concerns.   ? ?Margaretann Loveless, PA-C ?

## 2022-01-23 NOTE — Patient Instructions (Signed)
Christy Maldonado, thank you for joining Margaretann LovelessJennifer M Hanya Guerin, PA-C for today's virtual visit.  While this provider is not your primary care provider (PCP), if your PCP is located in our provider database this encounter information will be shared with them immediately following your visit. ? ?Consent: ?(Patient) Christy Maldonado provided verbal consent for this virtual visit at the beginning of the encounter. ? ?Current Medications: ? ?Current Outpatient Medications:  ?  amoxicillin (AMOXIL) 500 MG capsule, Take 1 capsule (500 mg total) by mouth 2 (two) times daily for 10 days., Disp: 20 capsule, Rfl: 0 ?  neomycin-polymyxin-hydrocortisone (CORTISPORIN) OTIC solution, Place 3 drops into the left ear 4 (four) times daily. X 7 days, Disp: 10 mL, Rfl: 0 ?  aluminum-magnesium hydroxide-simethicone (MAALOX) 200-200-20 MG/5ML SUSP, Take 30 mLs by mouth 4 (four) times daily -  before meals and at bedtime., Disp: 1680 mL, Rfl: 0 ?  hydrOXYzine (ATARAX) 50 MG tablet, Take 50 mg by mouth 3 (three) times daily as needed., Disp: , Rfl:  ?  ibuprofen (ADVIL) 600 MG tablet, Take 1 tablet (600 mg total) by mouth every 6 (six) hours as needed., Disp: 30 tablet, Rfl: 0 ?  Multiple Vitamin (MULTIVITAMIN WITH MINERALS) TABS tablet, Take 1 tablet by mouth daily., Disp: , Rfl:  ?  omeprazole (PRILOSEC) 20 MG capsule, Take 1 capsule (20 mg total) by mouth daily., Disp: 90 capsule, Rfl: 0  ? ?Medications ordered in this encounter:  ?Meds ordered this encounter  ?Medications  ? neomycin-polymyxin-hydrocortisone (CORTISPORIN) OTIC solution  ?  Sig: Place 3 drops into the left ear 4 (four) times daily. X 7 days  ?  Dispense:  10 mL  ?  Refill:  0  ?  Order Specific Question:   Supervising Provider  ?  Answer:   Eber HongMILLER, BRIAN [[id number]]  ? amoxicillin (AMOXIL) 500 MG capsule  ?  Sig: Take 1 capsule (500 mg total) by mouth 2 (two) times daily for 10 days.  ?  Dispense:  20 capsule  ?  Refill:  0  ?  Order Specific Question:    Supervising Provider  ?  Answer:   Eber HongMILLER, BRIAN [[id number]]  ?  ? ?*If you need refills on other medications prior to your next appointment, please contact your pharmacy* ? ?Follow-Up: ?Call back or seek an in-person evaluation if the symptoms worsen or if the condition fails to improve as anticipated. ? ?Other Instructions ?Otitis Media, Adult ? ?Otitis media occurs when there is inflammation and fluid in the middle ear with signs and symptoms of an acute infection. The middle ear is a part of the ear that contains bones for hearing as well as air that helps send sounds to the brain. When infected fluid builds up in this space, it causes pressure and can lead to an ear infection. The eustachian tube connects the middle ear to the back of the nose (nasopharynx) and normally allows air into the middle ear. If the eustachian tube becomes blocked, fluid can build up and become infected. ?What are the causes? ?This condition is caused by a blockage in the eustachian tube. This can be caused by mucus or by swelling of the tube. Problems that can cause a blockage include: ?A cold or other upper respiratory infection. ?Allergies. ?An irritant, such as tobacco smoke. ?Enlarged adenoids. The adenoids are areas of soft tissue located high in the back of the throat, behind the nose and the roof of the mouth. They are part of the body's  defense system (immune system). ?A mass in the nasopharynx. ?Damage to the ear caused by pressure changes (barotrauma). ?What increases the risk? ?You are more likely to develop this condition if you: ?Smoke or are exposed to tobacco smoke. ?Have an opening in the roof of your mouth (cleft palate). ?Have gastroesophageal reflux. ?Have an immune system disorder. ?What are the signs or symptoms? ?Symptoms of this condition include: ?Ear pain. ?Fever. ?Decreased hearing. ?Tiredness (lethargy). ?Fluid leaking from the ear, if the eardrum is ruptured or has burst. ?Ringing in the ear. ?How is this  diagnosed? ? ?This condition is diagnosed with a physical exam. During the exam, your health care provider will use an instrument called an otoscope to look in your ear and check for redness, swelling, and fluid. He or she will also ask about your symptoms. ?Your health care provider may also order tests, such as: ?A pneumatic otoscopy. This is a test to check the movement of the eardrum. It is done by squeezing a small amount of air into the ear. ?A tympanogram. This is a test that shows how well the eardrum moves in response to air pressure in the ear canal. It provides a graph for your health care provider to review. ?How is this treated? ?This condition can go away on its own within 3-5 days. But if the condition is caused by a bacterial infection and does not go away on its own, or if it keeps coming back, your health care provider may: ?Prescribe antibiotic medicine to treat the infection. ?Prescribe or recommend medicines to control pain. ?Follow these instructions at home: ?Take over-the-counter and prescription medicines only as told by your health care provider. ?If you were prescribed an antibiotic medicine, take it as told by your health care provider. Do not stop taking the antibiotic even if you start to feel better. ?Keep all follow-up visits. This is important. ?Contact a health care provider if: ?You have bleeding from your nose. ?There is a lump on your neck. ?You are not feeling better in 5 days. ?You feel worse instead of better. ?Get help right away if: ?You have severe pain that is not controlled with medicine. ?You have swelling, redness, or pain around your ear. ?You have stiffness in your neck. ?A part of your face is not moving (paralyzed). ?The bone behind your ear (mastoid bone) is tender when you touch it. ?You develop a severe headache. ?Summary ?Otitis media is redness, soreness, and swelling of the middle ear, usually resulting in pain and decreased hearing. ?This condition can go  away on its own within 3-5 days. ?If the problem does not go away in 3-5 days, your health care provider may give you medicines to treat the infection. ?If you were prescribed an antibiotic medicine, take it as told by your health care provider. ?Follow all instructions that were given to you by your health care provider. ?This information is not intended to replace advice given to you by your health care provider. Make sure you discuss any questions you have with your health care provider. ?Document Revised: 12/25/2020 Document Reviewed: 12/25/2020 ?Elsevier Patient Education ? 2023 Elsevier Inc. ? ? ? ?If you have been instructed to have an in-person evaluation today at a local Urgent Care facility, please use the link below. It will take you to a list of all of our available [facility], including address, phone number and hours of operation. Please do not delay care.  ?[facility] ? ?If you  or a family member do not have a primary care provider, use the link below to schedule a visit and establish care. When you choose a [facility] primary care physician or advanced practice provider, you gain a long-term partner in health. ?Find a Primary Care Provider ? ?Learn more about [facility]'s in-office and virtual care options: ?[facility] - Get Care Now ?
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
## 2022-01-30 ENCOUNTER — Encounter: Payer: Self-pay | Admitting: Physician Assistant

## 2022-01-30 ENCOUNTER — Ambulatory Visit (INDEPENDENT_AMBULATORY_CARE_PROVIDER_SITE_OTHER): Payer: Commercial Managed Care - HMO | Admitting: Physician Assistant

## 2022-01-30 VITALS — BP 126/87 | HR 103 | Temp 98.2°F | Resp 18 | Ht 65.0 in | Wt 111.0 lb

## 2022-01-30 DIAGNOSIS — M25561 Pain in right knee: Secondary | ICD-10-CM | POA: Diagnosis not present

## 2022-01-30 DIAGNOSIS — M25562 Pain in left knee: Secondary | ICD-10-CM

## 2022-01-30 DIAGNOSIS — G8929 Other chronic pain: Secondary | ICD-10-CM

## 2022-01-30 NOTE — Progress Notes (Signed)
? ?Established Patient Office Visit ? ?Subjective   ?Patient ID: Christy Maldonado, female    DOB: [birth year]  Age: 26 y.o. MRN: [id number] ? ?Chief Complaint  ?Patient presents with  ? Arthritis  ?  Bilateral Knee  ? ? ? ?States that she has been suffering from bilateral knee issues since she was in seventh grade.  States that she was diagnosed with arthritis in her right knee since she was 22. ? ?States that her pain in both knees has continued, states that she has difficulty standing or sitting for long.'s of time due to her knee pain. ? ?States that she was previously seen by orthopedics in [place] and does have paperwork from them stating that she has arthritis in her knees. ? ?States that she recently was seen by orthopedic provider in [place], feels that she was not taken seriously, states that they did oblige and give her an MRI and told her there was "nothing wrong and she can fix it herself" ? ?Mother is present via phone. ? ? ?Past Medical History:  ?Diagnosis Date  ? Anxiety   ? Phreesia 11/28/2020  ? Depression   ? Phreesia 11/28/2020  ? ?Social History  ? ?Socioeconomic History  ? Marital status: Single  ?  Spouse name: Not on file  ? Number of children: Not on file  ? Years of education: Not on file  ? Highest education level: Not on file  ?Occupational History  ? Not on file  ?Tobacco Use  ? Smoking status: Former  ?  Types: Cigarettes  ?  Quit date: 2018  ?  Years since quitting: 5.3  ? Smokeless tobacco: Never  ?Vaping Use  ? Vaping Use: Every day  ?Substance and Sexual Activity  ? Alcohol use: Not Currently  ?  Comment: occasionally  ? Drug use: Not Currently  ?  Types: Marijuana  ? Sexual activity: Yes  ?Other Topics Concern  ? Not on file  ?Social History Narrative  ? Not on file  ? ?Social Determinants of Health  ? ?Financial Resource Strain: Not on file  ?Food Insecurity: Not on file  ?Transportation Needs: Not on file  ?Physical Activity: Not on file  ?Stress: Not on file   ?Social Connections: Not on file  ?Intimate Partner Violence: Not on file  ? ?Family History  ?Problem Relation Age of Onset  ? Hypertension Mother   ? Diabetes Mother   ? Hypertension Father   ? ?No Known Allergies ?  ? ?Review of Systems  ?Constitutional: Negative.   ?HENT: Negative.    ?Eyes: Negative.   ?Respiratory:  Negative for shortness of breath.   ?Cardiovascular:  Negative for chest pain.  ?Gastrointestinal: Negative.   ?Genitourinary: Negative.   ?Musculoskeletal:  Positive for joint pain.  ?Skin: Negative.   ?Neurological: Negative.   ?Endo/Heme/Allergies: Negative.   ?Psychiatric/Behavioral: Negative.    ? ?  ?Objective:  ?  ? ?BP 126/87 (BP Location: Right Arm, Patient Position: Sitting, Cuff Size: Normal)   Pulse (!) 103   Temp 98.2 ?F (36.8 ?C) (Oral)   Resp 18   Ht 5\' 5"  (1.651 m)   Wt 111 lb (50.3 kg)   SpO2 100%   BMI 18.47 kg/m?  ? ? ?Physical Exam ?Vitals and nursing note reviewed.  ?Constitutional:   ?   Appearance: Normal appearance.  ?HENT:  ?   Head: Normocephalic and atraumatic.  ?   Right Ear: External ear normal.  ?   Left Ear: External ear  normal.  ?   Nose: Nose normal.  ?   Mouth/Throat:  ?   Mouth: Mucous membranes are moist.  ?   Pharynx: Oropharynx is clear.  ?Eyes:  ?   Extraocular Movements: Extraocular movements intact.  ?   Conjunctiva/sclera: Conjunctivae normal.  ?   Pupils: Pupils are equal, round, and reactive to light.  ?Cardiovascular:  ?   Rate and Rhythm: Normal rate and regular rhythm.  ?   Pulses: Normal pulses.  ?   Heart sounds: Normal heart sounds.  ?Pulmonary:  ?   Effort: Pulmonary effort is normal.  ?   Breath sounds: Normal breath sounds.  ?Musculoskeletal:  ?   Cervical back: Normal range of motion and neck supple.  ?   Right knee: No swelling. Normal range of motion. No tenderness.  ?   Left knee: No swelling. Normal range of motion. No tenderness.  ?Skin: ?   General: Skin is warm and dry.  ?Neurological:  ?   General: No focal deficit present.  ?    Mental Status: She is alert and oriented to person, place, and time.  ?Psychiatric:     ?   Mood and Affect: Mood normal.     ?   Thought Content: Thought content normal.     ?   Judgment: Judgment normal.  ? ? ? ?  ?Assessment & Plan:  ? ?Problem List Items Addressed This Visit   ?None ?Visit Diagnoses   ? ? Bilateral chronic knee pain    -  Primary  ? Relevant Orders  ? Ambulatory referral to Orthopedic Surgery  ? ?  ?1. Bilateral chronic knee pain ?Patient request referral to different orthopedic specialist for second opinion.  Patient encouraged to continue supportive care, red flags given for prompt reevaluation. ?- Ambulatory referral to Orthopedic Surgery ? ? ? ?I have reviewed the patient's medical history (PMH, PSH, Social History, Family History, Medications, and allergies) , and have been updated if relevant. I spent 20 minutes reviewing chart and  face to face time with patient. ? ? ?Return if symptoms worsen or fail to improve.  ? ? ?Brooklee Michelin S Mayers, PA-C ? ?

## 2022-01-30 NOTE — Patient Instructions (Signed)
I have started a referral for you to be seen by Ortho care.  Please let us know if you do not hear from them in the next few days. ? ?Please let us know if there is anything else we can do for you ? ?Kennieth Rad, PA-C ?Physician Assistant ?[place] ?[email or web address] ? ? ?Chronic Knee Pain, Adult ?Chronic knee pain is pain in one or both knees that lasts longer than 3 months. Symptoms of chronic knee pain may include swelling, stiffness, and discomfort. Age-related wear and tear (osteoarthritis) of the knee joint is the most common cause of chronic knee pain. Other possible causes include: ?A long-term immune-related disease that causes inflammation of the knee (rheumatoid arthritis). This usually affects both knees. ?Inflammatory arthritis, such as gout or pseudogout. ?An injury to the knee that causes arthritis. ?An injury to the knee that damages the ligaments. Ligaments are strong tissues that connect bones to each other. ?Runner's knee or pain behind the kneecap. ?Treatment for chronic knee pain depends on the cause. The main treatments for chronic knee pain are physical therapy and weight loss. This condition may also be treated with medicines, injections, a knee sleeve or brace, and by using crutches. Rest, ice, pressure (compression), and elevation, also known as RICE therapy, may also be recommended. ?Follow these instructions at home: ?If you have a knee sleeve or brace: ? ?Wear the knee sleeve or brace as told by your health care provider. Remove it only as told by your health care provider. ?Loosen it if your toes tingle, become numb, or turn cold and blue. ?Keep it clean. ?If the sleeve or brace is not waterproof: ?Do not let it get wet. ?Remove it if allowed by your health care provider, or cover it with a watertight covering when you take a bath or a shower. ?Managing pain, stiffness, and swelling ? ?  ? ?If directed, apply heat to the  affected area as often as told by your health care provider. Use the heat source that your health care provider recommends, such as a moist heat pack or a heating pad. ?If you have a removable knee sleeve or brace, remove it as told by your health care provider. ?Place a towel between your skin and the heat source. ?Leave the heat on for 20-30 minutes. ?Remove the heat if your skin turns bright red. This is especially important if you are unable to feel pain, heat, or cold. You may have a greater risk of getting burned. ?If directed, put ice on the affected area. To do this: ?If you have a removable knee sleeve or brace, remove it as told by your health care provider. ?Put ice in a plastic bag. ?Place a towel between your skin and the bag. ?Leave the ice on for 20 minutes, 2-3 times a day. ?Remove the ice if your skin turns bright red. This is very important. If you cannot feel pain, heat, or cold, you have a greater risk of damage to the area. ?Move your toes often to reduce stiffness and swelling. ?Raise (elevate) the injured area above the level of your heart while you are sitting or lying down. ?Activity ?Avoid high-impact activities or exercises, such as running, jumping rope, or doing jumping jacks. ?Follow the exercise plan that your health care provider designed for you. Your health care provider may suggest that you: ?Avoid activities that make knee pain worse. This may require you to change your exercise routines, sport participation, or job duties. ?Wear  shoes with cushioned soles. ?Avoid sports that require running and sudden changes in direction. ?Do physical therapy. Physical therapy is planned to match your needs and abilities. It may include exercises for strength, flexibility, stability, and endurance. ?Do exercises that increase balance and strength, such as tai chi and yoga. ?Do not use the injured limb to support your body weight until your health care provider says that you can. Use crutches as  told by your health care provider. ?Return to your normal activities as told by your health care provider. Ask your health care provider what activities are safe for you. ?General instructions ?Take over-the-counter and prescription medicines only as told by your health care provider. ?Lose weight if you are overweight. Losing even a little weight can reduce knee pain. Ask your health care provider what your ideal weight is, and how to safely lose extra weight. A dietitian may be able to help you plan your meals. ?Do not use any products that contain nicotine or tobacco, such as cigarettes, e-cigarettes, and chewing tobacco. These can delay healing. If you need help quitting, ask your health care provider. ?Keep all follow-up visits. This is important. ?Contact a health care provider if: ?You have knee pain that is not getting better or gets worse. ?You are unable to do your physical therapy exercises due to knee pain. ?Get help right away if: ?Your knee swells and the swelling becomes worse. ?You cannot move your knee. ?You have severe knee pain. ?Summary ?Knee pain that lasts more than 3 months is considered chronic knee pain. ?The main treatments for chronic knee pain are physical therapy and weight loss. You may also need to take medicines, wear a knee sleeve or brace, use crutches, and apply ice or heat. ?Losing even a little weight can reduce knee pain. Ask your health care provider what your ideal weight is, and how to safely lose extra weight. A dietitian may be able to help you plan your meals. ?Follow the exercise plan that your health care provider designed for you. ?This information is not intended to replace advice given to you by your health care provider. Make sure you discuss any questions you have with your health care provider. ?Document Revised: 03/01/2020 Document Reviewed: 03/01/2020 ?Elsevier Patient Education ? [place]. ? ?

## 2022-02-04 ENCOUNTER — Encounter: Payer: Self-pay | Admitting: Physician Assistant

## 2022-02-04 ENCOUNTER — Ambulatory Visit (INDEPENDENT_AMBULATORY_CARE_PROVIDER_SITE_OTHER): Payer: Commercial Managed Care - HMO

## 2022-02-04 ENCOUNTER — Ambulatory Visit (INDEPENDENT_AMBULATORY_CARE_PROVIDER_SITE_OTHER): Payer: Commercial Managed Care - HMO | Admitting: Physician Assistant

## 2022-02-04 DIAGNOSIS — M2351 Chronic instability of knee, right knee: Secondary | ICD-10-CM | POA: Insufficient documentation

## 2022-02-04 DIAGNOSIS — M25561 Pain in right knee: Secondary | ICD-10-CM

## 2022-02-04 DIAGNOSIS — S83005A Unspecified dislocation of left patella, initial encounter: Secondary | ICD-10-CM | POA: Diagnosis not present

## 2022-02-04 DIAGNOSIS — S83006A Unspecified dislocation of unspecified patella, initial encounter: Secondary | ICD-10-CM | POA: Insufficient documentation

## 2022-02-04 NOTE — Progress Notes (Signed)
? ?Office Visit Note ?  ?Patient: Christy Maldonado           ?Date of Birth: [birth year]           ?MRN: [id number] ?Visit Date: 02/04/2022 ?             ?Requested by: Mayers, Loraine Grip, PA-C ?[facility] [address] ?[place],  [place] [address] ?PCP: Camillia Herter, NP ? ?Chief Complaint  ?Patient presents with  ? Left Knee - New Patient (Initial Visit)  ? Right Knee - New Patient (Initial Visit)  ? ? ? ? ?HPI: ?Patient is a pleasant 26 year old woman with a chief complaint of bilateral knee pain and also recurrent patellar dislocations bilaterally she says that these self reduce but she is afraid to do anything and says she has several dislocations a month bilaterally.  She is wearing a patellar brace today..  She is status post right ACL reconstruction in 2016.  This surgery was done in [facility].  When she was 18 she was told she had arthritis in both of her knees.  She does not recount any injury since her ACL surgery.  She was seen by [facility] orthopedics but they told her there was nothing to be done and that she could fix it on her own.  In the past she has tried physical therapy, bracing, anti-inflammatories, injections, and has had no relief.  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  She does report she has had swelling in the knees previously.  She denies any other joint pain or joint dislocations just her knees.  She does report that she does have some hypermobility ? ?Assessment & Plan: ?Visit Diagnoses:  ?Bilateral knee pain and instability. ? ?Plan: I had a long discussion with the patient and her mother.  She has failed conservative treatment as listed above.  She cannot tell me which knee bothers her more.  She does have an MRI report from [place] that did not show any acute injuries some cartilage loss over the femoral condyle and some thickening of the retinaculum but nothing look acute.  She is going to get that MRI.  I will go  forward with an order for a right MRI.  She does have some instability with Lachman testing.  She will follow-up with Dr. Marlou Sa once this is complete ? ?Follow-Up Instructions: No follow-ups on file.  ? ?Ortho Exam ? ?Patient is alert, oriented, no adenopathy, well-dressed, normal affect, normal respiratory effort. ?Examination of her right knee she has mild soft tissue swelling but no effusion.  Well-healed surgical incisions.  She has some increased translation on Lachman testing.  She has apprehension with patella movement.  No medial or lateral joint line pain.  She does resist me bending her knee very far. ?Left knee she has been wearing her stabilization braces this is the knee with the most recent dislocation.  Mild soft tissue swelling no effusion no warmth.  She has a good endpoint on Lachman testing.  No medial or lateral specific joint pain she does have again significant apprehension ? ?Imaging: ?No results found. ?No images are attached to the encounter. ? ?Labs: ?Lab Results  ?Component Value Date  ? HGBA1C 5.3 01/03/2021  ? ? ? ?Lab Results  ?Component Value Date  ? ALBUMIN 4.8 11/23/2021  ? ALBUMIN 4.8 10/31/2020  ? ? ?No results found for: MG ?No results found for: VD25OH ? ?No  results found for: PREALBUMIN ? ?  Latest Ref Rng & Units 11/30/2021  ? 12:08 PM 11/23/2021  ?  4:45 PM 04/03/2021  ?  2:31 PM  ?CBC EXTENDED  ?WBC 4.0 - 10.5 K/uL 8.8   8.0   6.9    ?RBC 3.87 - 5.11 MIL/uL 4.94   5.04   4.67    ?Hemoglobin 12.0 - 15.0 g/dL 13.0   13.2   13.0    ?HCT 36.0 - 46.0 % 40.8   41.4   40.3    ?Platelets 150 - 400 K/uL 332   263   223    ?NEUT# 1.7 - 7.7 K/uL 6.3      ?Lymph# 0.7 - 4.0 K/uL 1.9      ? ? ? ?There is no height or weight on file to calculate BMI. ? ?Orders:  ?Orders Placed This Encounter  ?Procedures  ? XR KNEE 3 VIEW RIGHT  ? MR Knee Right w/o contrast  ? ?No orders of the defined types were placed in this encounter. ? ? ? Procedures: ?No procedures performed ? ?Clinical Data: ?No additional  findings. ? ?ROS: ? ?All other systems negative, except as noted in the HPI. ?Review of Systems ? ?Objective: ?Vital Signs: There were no vitals taken for this visit. ? ?Specialty Comments:  ?No specialty comments available. ? ?PMFS History: ?Patient Active Problem List  ? Diagnosis Date Noted  ? Bilateral chronic knee pain 01/30/2022  ? Anxiety state 11/02/2020  ? Constipation 11/02/2020  ? 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 Dyspepsia 11/02/2020  ? Tachycardia 11/02/2020  ? Panic attacks 09/30/2016  ? ?Past Medical History:  ?Diagnosis Date  ? Anxiety   ? Phreesia 11/28/2020  ? Depression   ? Phreesia 11/28/2020  ?  ?Family History  ?Problem Relation Age of Onset  ? Hypertension Mother   ? Diabetes Mother   ? Hypertension Father   ?  ?Past Surgical History:  ?Procedure Laterality Date  ? ARTHROSCOPIC REPAIR ACL  2016  ? EYE SURGERY N/A   ? Phreesia 11/28/2020  ? ?Social History  ? ?Occupational History  ? Not on file  ?Tobacco Use  ? Smoking status: Former  ?  Types: Cigarettes  ?  Quit date: 2018  ?  Years since quitting: 5.3  ? Smokeless tobacco: Never  ?Vaping Use  ? Vaping Use: Every day  ?Substance and Sexual Activity  ? Alcohol use: Not Currently  ?  Comment: occasionally  ? Drug use: Not Currently  ?  Types: Marijuana  ? Sexual activity: Yes  ? ? ? ? ? ?

## 2022-02-13 ENCOUNTER — Ambulatory Visit
Admission: RE | Admit: 2022-02-13 | Discharge: 2022-02-13 | Disposition: A | Discharge status: Home / Self Care | Payer: 59 | Source: Ambulatory Visit | Attending: Physician Assistant | Admitting: Physician Assistant

## 2022-02-13 DIAGNOSIS — M25561 Pain in right knee: Secondary | ICD-10-CM

## 2022-02-22 ENCOUNTER — Telehealth (INDEPENDENT_AMBULATORY_CARE_PROVIDER_SITE_OTHER): Payer: Self-pay | Admitting: Psychiatry

## 2022-02-22 ENCOUNTER — Encounter (HOSPITAL_COMMUNITY): Payer: Self-pay | Admitting: Psychiatry

## 2022-02-22 DIAGNOSIS — F411 Generalized anxiety disorder: Secondary | ICD-10-CM

## 2022-02-22 DIAGNOSIS — F321 Major depressive disorder, single episode, moderate: Secondary | ICD-10-CM

## 2022-02-22 DIAGNOSIS — F4321 Adjustment disorder with depressed mood: Secondary | ICD-10-CM

## 2022-02-22 MED ORDER — MIRTAZAPINE 7.5 MG PO TABS
7.5000 mg | ORAL_TABLET | Freq: Every day | ORAL | 1 refills | Status: DC
Start: 2022-02-22 — End: 2022-05-29

## 2022-02-22 NOTE — Progress Notes (Signed)
[facility] Follow up visit   Patient Identification: Christy Maldonado MRN:  [id number] Date of Evaluation:  02/22/2022 Referral Source: primary care Chief Complaint:   No chief complaint on file. Follow up grief, depression  Visit Diagnosis:    ICD-10-CM   1. Current moderate episode of major depressive disorder without prior episode (HCC)  F32.1     2. Grief  F43.21     3. GAD (generalized anxiety disorder)  F41.1      Virtual Visit via Video Note  I connected with [name] on 02/22/22 at10:25 AM by a video enabled telemedicine application and verified that I am speaking with the correct person using two identifiers.  Location: Patient: home Provider: home office   I discussed the limitations of evaluation and management by telemedicine and the availability of in person appointments. The patient expressed understanding and agreed to proceed.      I discussed the assessment and treatment plan with the patient. The patient was provided an opportunity to ask questions and all were answered. The patient agreed with the plan and demonstrated an understanding of the instructions.   The patient was advised to call back or seek an in-person evaluation if the symptoms worsen or if the condition fails to improve as anticipated.  I provided 15 minutes of non-face-to-face time during this encounter.   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  Patient states she was born premature she had a tough time growing up as mom had to take care of her and also patient's dad who had disability because of multiple strokes   SSRI didn't help prior.  Last visit started remeron, it has helped apetite, sleep and depression, feels more positive handling  grief related to GRand MA better Is able to deal with mom and her husband better   Feels positive about life  Anxiety improved At times have to take hydroxyzine    Aggravating factors; grandmother that in 2021.  Patient born premature  Modifying factors; parents, BF  Duration  more then 2 years   Severity improved  Hospital admission with suicide attempt denies    Past Psychiatric History: anxiety  Previous Psychotropic Medications: Yes  Zoloft and lexapro Substance Abuse History in the last 12 months:  No.  Consequences of Substance Abuse: NA  Past Medical History:  Past Medical History:  Diagnosis Date   Anxiety    Phreesia 11/28/2020   Depression    Phreesia 11/28/2020    Past Surgical History:  Procedure Laterality Date   ARTHROSCOPIC REPAIR ACL  2016   EYE SURGERY N/A    Phreesia 11/28/2020    Family Psychiatric History: denies. Dad had disability due to strokes  Family History:  Family History  Problem Relation Age of Onset   Hypertension Mother    Diabetes Mother    Hypertension Father     Social History:   Social History   Socioeconomic History   Marital status: Single    Spouse name: Not on file   Number of children: Not on file   Years of education: Not on file   Highest education level: Not on file  Occupational History   Not on file  Tobacco Use   Smoking status: Former    Types: Cigarettes  Quit date: 2018    Years since quitting: 5.4   Smokeless tobacco: Never  Vaping Use   Vaping Use: Every day  Substance and Sexual Activity   Alcohol use: Not Currently    Comment: occasionally   Drug use: Not Currently    Types: Marijuana   Sexual activity: Yes  Other Topics Concern   Not on file  Social History Narrative   Not on file   Social Determinants of Health   Financial Resource Strain: Not on file  Food Insecurity: Not on file  Transportation Needs: Not on file  Physical Activity: Not on file  Stress: Not on file   Social Connections: Not on file    Additional Social History: grew up with parents,she was born premature, mom struggled to take care of her husband and patient had retention problems born pre mature.   Allergies:  No Known Allergies  Metabolic Disorder Labs: Lab Results  Component Value Date   HGBA1C 5.3 01/03/2021   No results found for: PROLACTIN Lab Results  Component Value Date   CHOL 219 (H) 01/03/2021   TRIG 95 01/03/2021   HDL 87 01/03/2021   CHOLHDL 2.5 01/03/2021   LDLCALC 116 (H) 01/03/2021   Lab Results  Component Value Date   TSH 0.724 10/31/2020    Therapeutic Level Labs: No results found for: LITHIUM No results found for: CBMZ No results found for: VALPROATE  Current Medications: Current Outpatient Medications  Medication Sig Dispense Refill   hydrOXYzine (ATARAX) 50 MG tablet Take 50 mg by mouth 3 (three) times daily as needed.     mirtazapine (REMERON) 7.5 MG tablet Take 1 tablet (7.5 mg total) by mouth at bedtime. 30 tablet 1   Multiple Vitamin (MULTIVITAMIN WITH MINERALS) TABS tablet Take 1 tablet by mouth daily.     neomycin-polymyxin-hydrocortisone (CORTISPORIN) OTIC solution Place 3 drops into the left ear 4 (four) times daily. X 7 days 10 mL 0   omeprazole (PRILOSEC) 20 MG capsule Take 1 capsule (20 mg total) by mouth daily. 90 capsule 0   No current facility-administered medications for this visit.     Psychiatric Specialty Exam: Review of Systems  Cardiovascular:  Negative for chest pain.  Neurological:  Negative for tremors.  Psychiatric/Behavioral:  Negative for agitation, hallucinations and self-injury.    There were no vitals taken for this visit.There is no height or weight on file to calculate BMI.  General Appearance: Casual  Eye Contact:  Fair  Speech:  Slow  Volume:  Decreased  Mood: improved  Affect:  Constricted  Thought Process:  Goal Directed  Orientation:  Full (Time, Place, and Person)  Thought Content:  Rumination   Suicidal Thoughts:  No  Homicidal Thoughts:  No  Memory:  Immediate;   Fair  Judgement:  Fair  Insight:  Fair  Psychomotor Activity:  Decreased  Concentration:  Concentration: Fair  Recall:  [employer] of Knowledge:Fair  Language: Fair  Akathisia:  No  Handed:    AIMS (if indicated):  not done  Assets:  Desire for Improvement Financial Resources/Insurance Social Support  ADL's:  Intact  Cognition: WNL  Sleep:  Fair   Screenings: GAD-7    Flowsheet Row Office Visit from 01/30/2022 in Primary Care at [facility] Visit from 10/31/2020 in Primary Care at [facility]  Total GAD-7 Score 1 10      PHQ2-9    Flowsheet Row Office Visit from 01/30/2022 in Primary Care at [facility] Visit from  01/23/2022 in [facility] AT [place] Office Visit [facility]/03[facility]20[facility]  PHQ-2 Total Score 0 2 0 2 0  PHQ-9 Total Score -- 10 -- 6 0      Flowsheet Row Video Visit from 02/22/2022 in [facility] AT [place] ED from 12/26/2021 in [facility] ED from [facility]  C-SSRS RISK CATEGORY No Risk No Risk No Risk       Assessment and Plan: as follows   Prior documentation reviewed  Major depressive disorder moderate to severe single episode; improved continue remeron  Grief; improving with depression and med, handling better Generalized anxiety disorder; improving, takes vistaril prn but able to distract from negative or anxious thoughts  Reviewed medications questions addressed provided supportive therapy  Fu 123m Renewed remeron Coded via complexity   Thresa RossNadeem Soleil Mas, MD 5/26/202310:56 AM

## 2022-02-28 ENCOUNTER — Encounter: Payer: Self-pay | Admitting: Orthopedic Surgery

## 2022-02-28 ENCOUNTER — Ambulatory Visit (INDEPENDENT_AMBULATORY_CARE_PROVIDER_SITE_OTHER): Payer: Commercial Managed Care - PPO | Admitting: Orthopedic Surgery

## 2022-02-28 DIAGNOSIS — M2242 Chondromalacia patellae, left knee: Secondary | ICD-10-CM | POA: Diagnosis not present

## 2022-02-28 NOTE — Progress Notes (Signed)
Office Visit Note   Patient: Christy Maldonado           Date of Birth: 1996-01-16           MRN: [id number] Visit Date: 02/28/2022 Requested by: Rema Fendt, NP [address] [place],  [place] [address] PCP: Rema Fendt, NP  Subjective: Chief Complaint  Patient presents with   Right Knee - Pain    HPI: Patient presents for evaluation of bilateral knee pain.  She had right knee surgery about 10 years ago which was ACL allograft.  She also has a history of patellar subluxations.  Describes weakness and giving way.  She has tried physical therapy and currently uses 2 braces.  She has also tried injections and over-the-counter medications without relief.  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  No surgery on the left knee.  She has MRI scans which are reviewed.  MRI scan on the left she has what appears to be some lateral maltracking of the patella with no bone bruising indicating any recent patellar subluxation or dislocation.  She also has what appears to be likely chondral defect on both the lateral femoral condyle and undersurface of the patella.  ACL PCL intact on the left.  MRI scan on the right knee also reviewed with the patient.  She has somewhat diminutive graft on the right which was an allograft.  She also has chondromalacia changes both the medial and lateral femoral condyle regions with no acute meniscal pathology.  Patella on that side also has mild lateral positioning relative to the thin trochlea.  Patella alto also present bilaterally.              ROS: All systems reviewed are negative as they relate to the chief complaint within the history of present illness.  Patient denies  fevers or chills.   Assessment & Plan: Visit Diagnoses:  1. 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  Plan is that we talked a long time today about operative and nonoperative treatment options.  In general for the right knee the knee is stable although that graft does have some laxity there is no endpoint.  No collateral ligament or posterior lateral rotatory instability noted in the right knee.  No effusion.  Having quite a lot of pain even with just bending the knee for can exam.  This is present on both the left and right-hand side.  Not much patellofemoral crepitus on the right.  I think in general the right knee is about as good as it can get.  She does have some arthritic changes in the knee but no effusion which is a good sign.  She cannot really do injections anymore so we do not have great options for treatment on that right knee.  On the left-hand side she does have a chondral defect which is giving her some mechanical symptoms with knee flexion and extension.  This is palpable today.  Again a lot of pain even with bending the knee from totally straight to 90 degrees of flexion.  No effusion in the knee joint which is a good  sign.  Collateral crucial ligaments stable on that side as well.  I think that if any surgical intervention would be considered on the left-hand side we would have to consider possible MPFL reconstruction with chondroplasty and microfracture with bio cartilage.  All this is discussed with the patient.  In general patient is most interested in potentially obtaining disability rating.  I cannot really do that for her at this time but I did tell her that the Social Security administration will examine these notes and make their determination about her fitness for work.  I will see her back as needed.  Follow-Up Instructions: Return if symptoms worsen or fail to improve.   Orders:  No orders of the defined types were  placed in this encounter.  No orders of the defined types were placed in this encounter.     Procedures: No procedures performed   Clinical Data: No additional findings.  Objective: Vital Signs: There were no vitals taken for this visit.  Physical Exam:   Constitutional: Patient appears well-developed HEENT:  Head: Normocephalic Eyes:EOM are normal Neck: Normal range of motion Cardiovascular: Normal rate Pulmonary/chest: Effort normal Neurologic: Patient is alert Skin: Skin is warm Psychiatric: Patient has normal mood and affect   Ortho Exam: Ortho exam demonstrates normal gait alignment.  She has no increased Q angle's bilaterally.  Pedal pulses palpable.  Prereasonable quad strength bilaterally.  Does have some  There is  Periretinacular patellar tenderness in both knees.  Some patellar apprehension is also present in both knees but I cannot frankly dislocate either patella.  Extensor mechanism intact.  Patient has essentially full range of motion but more patellofemoral crepitus on the left than the right.  Patient has good stability to varus stress at 0 30 and 90 degrees on the left-hand side and stable ACL PCL.  On the right she has about 3 mm more anterior drawer on the right with good endpoint compared to the left.  No partial rotatory instability on either side.  No other masses lymphadenopathy or skin changes noted in either knee region.  Specialty Comments:  No specialty comments available.  Imaging: No results found.   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   Patellar dislocation 02/04/2022   Bilateral chronic knee pain 01/30/2022   Anxiety state 11/02/2020   Constipation 11/02/2020   Psychophysiological insomnia 11/02/2020   Dyspepsia 11/02/2020   Tachycardia 11/02/2020   Panic attacks 09/30/2016   Past Medical History:  Diagnosis Date   Anxiety    [employer]rees[employer] 11/28/2020    Depression    [employer] 11/28/2020    Family History  Problem Relation Age of Onset   Hypertension Mother    Diabetes Mother    Hypertension Father     Past Surgical History:  Procedure Laterality Date   ARTHROSCOPIC REPAIR ACL  2016   EYE SURGERY N/A    [employer] 11/28/2020   Social History   Occupational History   Not on file  Tobacco Use   Smoking status: Former    Types: Cigarettes    Quit date: 2018    Years since quitting: 5.4   Smokeless tobacco: Never  Vaping Use   Vaping Use: Every day  Substance and Sexual Activity   Alcohol use: Not Currently    Comment: occasionally   Drug use: Not Currently    Types: Marijuana   Sexual activity: Yes

## 2022-04-26 ENCOUNTER — Encounter (HOSPITAL_COMMUNITY): Payer: Self-pay

## 2022-04-26 ENCOUNTER — Telehealth (HOSPITAL_COMMUNITY): Payer: Commercial Managed Care - PPO | Admitting: Psychiatry

## 2022-05-29 ENCOUNTER — Other Ambulatory Visit (HOSPITAL_COMMUNITY): Payer: Self-pay | Admitting: Psychiatry

## 2022-06-18 ENCOUNTER — Other Ambulatory Visit (HOSPITAL_COMMUNITY): Payer: Self-pay | Admitting: Psychiatry

## 2022-08-03 ENCOUNTER — Other Ambulatory Visit: Payer: Self-pay | Admitting: Family

## 2022-08-03 DIAGNOSIS — R1013 Epigastric pain: Secondary | ICD-10-CM

## 2022-08-03 DIAGNOSIS — F411 Generalized anxiety disorder: Secondary | ICD-10-CM

## 2022-08-07 MED ORDER — HYDROXYZINE HCL 50 MG PO TABS: 50.0000 mg | ORAL_TABLET | Freq: Three times a day (TID) | ORAL | 0 refills | Status: DC | PRN

## 2022-08-07 MED ORDER — OMEPRAZOLE 20 MG PO CPDR: 20.0000 mg | DELAYED_RELEASE_CAPSULE | Freq: Every day | ORAL | 0 refills | Status: AC

## 2022-10-09 ENCOUNTER — Other Ambulatory Visit: Payer: Self-pay

## 2022-10-09 ENCOUNTER — Emergency Department (HOSPITAL_BASED_OUTPATIENT_CLINIC_OR_DEPARTMENT_OTHER)
Admission: EM | Admit: 2022-10-09 | Discharge: 2022-10-09 | Disposition: A | Discharge status: Home / Self Care | Payer: Commercial Managed Care - PPO | Attending: Emergency Medicine | Admitting: Emergency Medicine

## 2022-10-09 DIAGNOSIS — R52 Pain, unspecified: Secondary | ICD-10-CM

## 2022-10-09 DIAGNOSIS — U071 COVID-19: Secondary | ICD-10-CM

## 2022-10-09 DIAGNOSIS — J029 Acute pharyngitis, unspecified: Secondary | ICD-10-CM

## 2022-10-09 LAB — RESP PANEL BY RT-PCR (RSV, FLU A&B, COVID)  RVPGX2
Influenza A by PCR: NEGATIVE
Influenza B by PCR: NEGATIVE
Resp Syncytial Virus by PCR: NEGATIVE
SARS Coronavirus 2 by RT PCR: POSITIVE — AB

## 2022-10-09 LAB — GROUP A STREP BY PCR: Group A Strep by PCR: NOT DETECTED

## 2022-10-09 MED ORDER — ACETAMINOPHEN 160 MG/5ML PO SOLN
650.0000 mg | Freq: Once | ORAL | Status: AC
  Administered 2022-10-09: 650 mg via ORAL
  Filled 2022-10-09: qty 20.3

## 2022-10-09 NOTE — ED Notes (Signed)
Pt verbalized understanding of discharge instructions. Opportunity for questions provided. Work note provided.

## 2022-10-09 NOTE — ED Notes (Signed)
Strep swab collected and sent as well

## 2022-10-09 NOTE — ED Triage Notes (Signed)
Pt via pov from work with sore throat and body aches today. Pt denies sob, n/v. Pt alert & oriented, nad noted.

## 2022-10-09 NOTE — Discharge Instructions (Addendum)
You were evaluated today for sore throat and body aches. You did test positive for Covid-19. Your strep test was negative. Please use acetaminophen and ibuprofen as needed for pain and fever control.

## 2022-10-09 NOTE — ED Provider Notes (Signed)
[facility] Provider Note   CSN: [id number] Arrival date & time: 10/09/22  1008     History  Chief Complaint  Patient presents with   Sore Throat    Christy Maldonado is a 27 y.o. female.  Patient presents emergency department complaining of sore throat and bodyaches which began this morning.  Patient currently denies shortness of breath, nausea, vomiting, cough.  Patient states that she does not remember having any fevers since being in elementary school aged child.  Patient is febrile upon arrival with a temperature of 100.8 F.  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  Past medical history significant for anxiety and depression  HPI     Home Medications Prior to Admission medications   Medication Sig Start Date End Date Taking? Authorizing Provider  hydrOXYzine (ATARAX) 50 MG tablet Take 1 tablet (50 mg total) by mouth 3 (three) times daily as needed. 08/07/22   Camillia Herter, NP  mirtazapine (REMERON) 7.5 MG tablet TAKE 1 TABLET BY MOUTH AT BEDTIME 05/29/22   Merian Capron, MD  Multiple Vitamin (MULTIVITAMIN WITH MINERALS) TABS tablet Take 1 tablet by mouth daily.    [provider]  neomycin-polymyxin-hydrocortisone (CORTISPORIN) OTIC solution Place 3 drops into the left ear 4 (four) times daily. X 7 days 01/23/22   Mar Daring, PA-C  omeprazole (PRILOSEC) 20 MG capsule Take 1 capsule (20 mg total) by mouth daily. 08/07/22   Camillia Herter, NP      Allergies    Patient has no known allergies.    Review of Systems   Review of Systems  Constitutional:  Positive for fever.  HENT:  Positive for sore throat.   Musculoskeletal:  Positive for myalgias.    Physical Exam Updated Vital Signs BP 109/69 (BP Location: Right Arm)   Pulse (!) 107   Temp (!) 100.8 F (38.2 C) (Oral)   Resp 18   Ht 5\' 5"  (1.651 m)   Wt 54.4 kg   LMP 10/09/2022   SpO2 100%   BMI 19.97 kg/m   Physical Exam Vitals and nursing note reviewed.  Constitutional:      General: She is not in acute distress.    Appearance: She is well-developed.  HENT:     Head: Normocephalic and atraumatic.     Mouth/Throat:     Mouth: Mucous membranes are moist.     Pharynx: Posterior oropharyngeal erythema present. No pharyngeal swelling or oropharyngeal exudate.     Tonsils: No tonsillar exudate or tonsillar abscesses.  Eyes:     Conjunctiva/sclera: Conjunctivae normal.  Cardiovascular:     Rate and Rhythm: Normal rate and regular rhythm.     Heart sounds: No murmur heard. Pulmonary:     Effort: Pulmonary effort is normal. No respiratory distress.     Breath sounds: Normal breath sounds.  Abdominal:     Palpations: Abdomen is soft.     Tenderness: There is no abdominal tenderness.  Musculoskeletal:        General: No swelling.     Cervical back: Neck supple.  Skin:    General: Skin is warm and dry.     Capillary Refill: Capillary refill takes less than 2 seconds.  Neurological:     Mental Status: She is alert.  Psychiatric:        Mood and Affect: Mood normal.     ED Results / Procedures / Treatments   Labs (all  labs ordered are listed, but only abnormal results are displayed) Labs Reviewed  RESP PANEL BY RT-PCR (RSV, FLU A&B, COVID)  RVPGX2 - Abnormal; Notable for the following components:      Result Value   SARS Coronavirus 2 by RT PCR POSITIVE (*)    All other components within normal limits  GROUP A STREP BY PCR    EKG None  Radiology No results found.  Procedures Procedures    Medications Ordered in ED Medications  acetaminophen (TYLENOL) 160 MG/5ML solution 650 mg (650 mg Oral Given 10/09/22 1106)    ED Course/ Medical Decision Making/ A&P                           Medical Decision Making Risk OTC drugs.   Patient presents with a chief complaint of sore throat and bodyaches.  Differential diagnosis includes but is not limited to COVID-19, influenza,  strep throat, and others  The patient has multiple behavioral health visits over the past year but no other relevant medical charts available for review  I ordered a respiratory panel and a group A strep test. Strep test was negative. Respiratory panel with positive Covid results  There is medication at this time for imaging  I ordered the patient Tylenol for her fever.  Upon reassessment the patient was feeling slightly better  The patient has COVID-19. Plan to discharge patient home at this time with recommendations for supportive care. The patient has no comorbidities that would necessitate antiviral medications.  Group A strep test was negative.  Return precautions provided.         Final Clinical Impression(s) / ED Diagnoses Final diagnoses:  Sore throat  Generalized body aches  COVID-19    Rx / DC Orders ED Discharge Orders     None         Ronny Bacon 10/09/22 [place], Adam, DO 10/09/22 1206

## 2022-10-15 ENCOUNTER — Telehealth: Payer: Self-pay | Admitting: Family Medicine

## 2022-10-15 DIAGNOSIS — R110 Nausea: Secondary | ICD-10-CM

## 2022-10-15 DIAGNOSIS — R052 Subacute cough: Secondary | ICD-10-CM

## 2022-10-15 DIAGNOSIS — K5900 Constipation, unspecified: Secondary | ICD-10-CM

## 2022-10-15 MED ORDER — POLYETHYLENE GLYCOL 3350 17 GM/SCOOP PO POWD: 17.0000 g | Freq: Two times a day (BID) | ORAL | 1 refills | Status: DC | PRN

## 2022-10-15 MED ORDER — ONDANSETRON HCL 4 MG PO TABS: 4.0000 mg | ORAL_TABLET | Freq: Three times a day (TID) | ORAL | 0 refills | Status: DC | PRN

## 2022-10-15 NOTE — Progress Notes (Signed)
Virtual Visit Consent   Christy Maldonado, you are scheduled for a virtual visit with a [facility] provider today. Just as with appointments in the office, your consent must be obtained to participate. Your consent will be active for this visit and any virtual visit you may have with one of our providers in the next 365 days. If you have a MyChart account, a copy of this consent can be sent to you electronically.  As this is a virtual visit, video technology does not allow for your provider to perform a traditional examination. This may limit your provider's ability to fully assess your condition. If your provider identifies any concerns that need to be evaluated in person or the need to arrange testing (such as labs, EKG, etc.), we will make arrangements to do so. Although advances in technology are sophisticated, we cannot ensure that it will always work on either your end or our end. If the connection with a video visit is poor, the visit may have to be switched to a telephone visit. With either a video or telephone visit, we are not always able to ensure that we have a secure connection.  By engaging in this virtual visit, you consent to the provision of healthcare and authorize for your insurance to be billed (if applicable) for the services provided during this visit. Depending on your insurance coverage, you may receive a charge related to this service.  I need to obtain your verbal consent now. Are you willing to proceed with your visit today? Christy Maldonado has provided verbal consent on 10/15/2022 for a virtual visit (video or telephone). Christy Mayo, NP  Date: 10/15/2022 10:11 AM  Virtual Visit via Video Note   I, Christy Maldonado, connected with  Christy Maldonado  ([id number], 1996-05-05) on 10/15/22 at 10:15 AM EST by a video-enabled telemedicine application and verified that I am speaking with the correct person using two  identifiers.  Location: Patient: Virtual Visit Location Patient: Home Provider: Virtual Visit Location Provider: Home Office   I discussed the limitations of evaluation and management by telemedicine and the availability of in person appointments. The patient expressed understanding and agreed to proceed.    History of Present Illness: Christy Maldonado is a 27 y.o. who identifies as a female who was assigned female at birth, and is being seen today for stomach pains. Has increased anxiety related to having covid for the first time.  Breathing well.  Constipated- has only had two BMs 10/09/2022- one was small balls- hard, and the second was more diarrhea like. Is normally everyday BM. Limited food intake- some soups, and liquids. Water makes her nausea.  Was on ibuprofen and tylenol for aches and fevers Theraflu day and night. Denies fevers, chills, shortness of breath, chest pain.  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   Patellar dislocation 02/04/2022   Bilateral chronic knee pain 01/30/2022   Anxiety state 11/02/2020   Constipation 11/02/2020   Psychophysiological insomnia 11/02/2020   Dyspepsia 11/02/2020   Tachycardia 11/02/2020   Panic attacks 09/30/2016    Allergies: No Known Allergies Medications:  Current Outpatient Medications:    hydrOXYzine (ATARAX) 50 MG tablet, Take 1 tablet (50 mg total) by mouth 3 (three) times daily as needed., Disp: 90 tablet, Rfl: 0   mirtazapine (REMERON) 7.5 MG tablet, TAKE 1 TABLET BY MOUTH AT BEDTIME, Disp: 30 tablet, Rfl: 0   Multiple Vitamin (  MULTIVITAMIN WITH MINERALS) TABS tablet, Take 1 tablet by mouth daily., Disp: , Rfl:    neomycin-polymyxin-hydrocortisone (CORTISPORIN) OTIC solution, Place 3 drops into the left ear 4 (four) times daily. X 7 days, Disp: 10 mL, Rfl: 0   omeprazole (PRILOSEC) 20 MG capsule, Take 1 capsule (20 mg total) by mouth daily.,  Disp: 90 capsule, Rfl: 0  Observations/Objective: Patient is well-developed, well-nourished in no acute distress.  Resting comfortably  at home.  Head is normocephalic, atraumatic.  No labored breathing.  Speech is clear and coherent with logical content.  Patient is alert and oriented at baseline.  Cough  Assessment and Plan:  1. Nausea  - ondansetron (ZOFRAN) 4 MG tablet; Take 1 tablet (4 mg total) by mouth every 8 (eight) hours as needed for nausea or vomiting.  Dispense: 15 tablet; Refill: 0  2. Constipation, unspecified constipation type  - polyethylene glycol powder (GLYCOLAX/MIRALAX) 17 GM/SCOOP powder; Take 17 g by mouth 2 (two) times daily as needed.  Dispense: 3350 g; Refill: 1   3. Subacute cough    -bland diet -push fluids -use zofran as needed and sparingly (review of constipation risk)  -delsym for cough -work note  Reviewed side effects, risks and benefits of medication.    Patient acknowledged agreement and understanding of the plan.   Past Medical, Surgical, Social History, Allergies, and Medications have been Reviewed.    Follow Up Instructions: I discussed the assessment and treatment plan with the patient. The patient was provided an opportunity to ask questions and all were answered. The patient agreed with the plan and demonstrated an understanding of the instructions.  A copy of instructions were sent to the patient via MyChart unless otherwise noted below.    The patient was advised to call back or seek an in-person evaluation if the symptoms worsen or if the condition fails to improve as anticipated.  Time:  I spent 10 minutes with the patient via telehealth technology discussing the above problems/concerns.    Christy Mayo, NP

## 2022-10-15 NOTE — Patient Instructions (Addendum)
Christy Maldonado, thank you for joining [name], NP for today's virtual visit.  While this provider is not your primary care provider (PCP), if your PCP is located in our provider database this encounter information will be shared with them immediately following your visit.   [facility] account gives you access to today's visit and all your visits, tests, and labs performed at [facility] " click here if you don't have a [facility] account or go to [email or web address]  Consent: (Patient) Christy Maldonado provided verbal consent for this virtual visit at the beginning of the encounter.  Current Medications:  Current Outpatient Medications:    ondansetron (ZOFRAN) 4 MG tablet, Take 1 tablet (4 mg total) by mouth every 8 (eight) hours as needed for nausea or vomiting., Disp: 15 tablet, Rfl: 0   polyethylene glycol powder (GLYCOLAX/MIRALAX) 17 GM/SCOOP powder, Take 17 g by mouth 2 (two) times daily as needed., Disp: 3350 g, Rfl: 1   hydrOXYzine (ATARAX) 50 MG tablet, Take 1 tablet (50 mg total) by mouth 3 (three) times daily as needed., Disp: 90 tablet, Rfl: 0   mirtazapine (REMERON) 7.5 MG tablet, TAKE 1 TABLET BY MOUTH AT BEDTIME, Disp: 30 tablet, Rfl: 0   Multiple Vitamin (MULTIVITAMIN WITH MINERALS) TABS tablet, Take 1 tablet by mouth daily., Disp: , Rfl:    neomycin-polymyxin-hydrocortisone (CORTISPORIN) OTIC solution, Place 3 drops into the left ear 4 (four) times daily. X 7 days, Disp: 10 mL, Rfl: 0   omeprazole (PRILOSEC) 20 MG capsule, Take 1 capsule (20 mg total) by mouth daily., Disp: 90 capsule, Rfl: 0   Medications ordered in this encounter:  Meds ordered this encounter  Medications   ondansetron (ZOFRAN) 4 MG tablet    Sig: Take 1 tablet (4 mg total) by mouth every 8 (eight) hours as needed for nausea or vomiting.    Dispense:  15 tablet    Refill:  0    Order Specific Question:   Supervising Provider     Answer:   Chase Picket [[id number]]   polyethylene glycol powder (GLYCOLAX/MIRALAX) 17 GM/SCOOP powder    Sig: Take 17 g by mouth 2 (two) times daily as needed.    Dispense:  3350 g    Refill:  1    Order Specific Question:   Supervising Provider    Answer:   Chase Picket [id number]     *If you need refills on other medications prior to your next appointment, please contact your pharmacy*  Follow-Up: Call back or seek an in-person evaluation if the symptoms worsen or if the condition fails to improve as anticipated.  [place] [phone]  Other Instructions  -push fluids,  Use Miralax as directed and discussed Delsym for cough Zofran only as needed  Work note in [place] A bland diet may consist of soft foods or foods that are not high in fat or are not greasy, acidic, or spicy. Avoiding certain foods may cause less irritation to your mouth, throat, stomach, or gastrointestinal tract. Avoiding certain foods may make you feel better. Everyone's tolerances are different. A bland diet should be based on what you can tolerate and what may cause discomfort. What is my plan? Your health care provider or dietitian may recommend specific changes to your diet to treat your symptoms. These changes may include: Eating small meals frequently. Cooking food until it is soft enough to chew easily. Taking the time to chew  your food thoroughly, so it is easy to swallow and digest. Avoiding foods that cause you discomfort. These may include spicy food, fried food, greasy foods, hard-to-chew foods, or citrus fruits and juices. Drinking slowly. What are tips for following this plan? Reading food labels To reduce fiber intake, look for food labels that say "whole," such as whole wheat or whole grain. Shopping Avoid food items that may have nuts or seeds. Avoid vegetables that may make you gassy or have a tough texture, such as broccoli, cauliflower, or  corn. Cooking Cook foods thoroughly so they have a soft texture. Meal planning Make sure you include foods from all food groups to eat a balanced diet. Eat a variety of types of foods. Eat foods and drink beverages that do not cause you discomfort. These may include soups and broths with cooked meats, pasta, and vegetables. Lifestyle Sit up after meals, avoid tight clothing, and take time to eat and chew your food slowly. Ask your health care provider whether you should take dietary supplements. General information Mildly season your foods. Some seasonings, such as cayenne pepper, vinegar, or hot sauce, may cause irritation. The foods, beverages, or seasonings to avoid should be based on individual tolerance. What foods should I eat? Fruits Canned or cooked fruit such as peaches, pears, or applesauce. Bananas. Vegetables Well-cooked vegetables. Canned or cooked vegetables such as carrots, green beans, beets, or spinach. Mashed or boiled potatoes. Grains  Hot cereals, such as cream of wheat and processed oatmeal. Rice. Bread, crackers, pasta, or tortillas made from refined white flour. Meats and other proteins  Eggs. Creamy peanut butter or other nut butters. Lean, well-cooked tender meats, such as beef, pork, chicken, or fish. Dairy Low-fat dairy products such as milk, cottage cheese, or yogurt. Beverages  Water. Herbal tea. Apple juice. Fats and oils Mild salad dressings. Canola or olive oil. Sweets and desserts Low-fat pudding, custard, or ice cream. Fruit gelatin. The items listed above may not be a complete list of foods and beverages you can eat. Contact a dietitian for more information. What foods should I avoid? Fruits Citrus fruits, such as oranges and grapefruit. Fruits with a stringy texture. Fruits that have lots of seeds, such as kiwi or strawberries. Dried fruits. Vegetables Raw, uncooked vegetables. Salads. Grains Whole grain breads, muffins, and cereals. Meats  and other proteins Tough, fibrous meats. Highly seasoned meat such as corned beef, smoked meats, or fish. Processed high-fat meats such as brats, hot dogs, or sausage. Dairy Full-fat dairy foods such as ice cream and cheese. Beverages Caffeinated drinks. Alcohol. Seasonings and condiments Strongly flavored seasonings or condiments. Hot sauce. Salsa. Other foods Spicy foods. Fried or greasy foods. Sour foods, such as pickled or fermented foods like sauerkraut. Foods high in fiber. The items listed above may not be a complete list of foods and beverages you should avoid. Contact a dietitian for more information. Summary A bland diet should be based on individual tolerance. It may consist of foods that are soft textured and do not have a lot of fat, fiber, acid, or seasonings. A bland diet may be recommended because avoiding certain foods, beverages, or spices may make you feel better. This information is not intended to replace advice given to you by your health care provider. Make sure you discuss any questions you have with your health care provider. Document Revised: 08/06/2021 Document Reviewed: 08/06/2021 Elsevier Patient Education  [place].    If you have been instructed to have an in-person evaluation  today at a local Urgent Care facility, please use the link below. It will take you to a list of all of our available [facility], including address, phone number and hours of operation. Please do not delay care.  [facility]  If you or a family member do not have a primary care provider, use the link below to schedule a visit and establish care. When you choose a [facility] primary care physician or advanced practice provider, you gain a long-term partner in health. Find a Primary Care Provider  Learn more about [facility]'s in-office and virtual care options: [facility]

## 2022-12-20 ENCOUNTER — Telehealth (INDEPENDENT_AMBULATORY_CARE_PROVIDER_SITE_OTHER): Payer: Self-pay | Admitting: Psychiatry

## 2022-12-20 ENCOUNTER — Encounter (HOSPITAL_COMMUNITY): Payer: Self-pay | Admitting: Psychiatry

## 2022-12-20 DIAGNOSIS — F4321 Adjustment disorder with depressed mood: Secondary | ICD-10-CM

## 2022-12-20 DIAGNOSIS — F321 Major depressive disorder, single episode, moderate: Secondary | ICD-10-CM

## 2022-12-20 DIAGNOSIS — F411 Generalized anxiety disorder: Secondary | ICD-10-CM

## 2022-12-20 MED ORDER — HYDROXYZINE HCL 25 MG PO TABS: 25.0000 mg | ORAL_TABLET | Freq: Every day | ORAL | 0 refills | Status: DC | PRN

## 2022-12-20 MED ORDER — MIRTAZAPINE 7.5 MG PO TABS: 7.5000 mg | ORAL_TABLET | Freq: Every day | ORAL | 1 refills | Status: DC

## 2022-12-20 NOTE — Progress Notes (Signed)
[facility] Follow up visit   Patient Identification: Christy Maldonado MRN:  [id number] Date of Evaluation:  12/20/2022 Referral Source: primary care Chief Complaint:   No chief complaint on file. Follow up grief, depression  Visit Diagnosis:    ICD-10-CM   1. Current moderate episode of major depressive disorder without prior episode ([place])  F32.1     2. GAD (generalized anxiety disorder)  F41.1 hydrOXYzine (ATARAX) 25 MG tablet    3. Grief  F43.21      Virtual Visit via Video Note  I connected with Christy Maldonado on 12/20/22 at 12:00 PM EDT by a video enabled telemedicine application and verified that I am speaking with the correct person using two identifiers.  Location: Patient: parked car Provider: home office   I discussed the limitations of evaluation and management by telemedicine and the availability of in person appointments. The patient expressed understanding and agreed to proceed.      I discussed the assessment and treatment plan with the patient. The patient was provided an opportunity to ask questions and all were answered. The patient agreed with the plan and demonstrated an understanding of the instructions.   The patient was advised to call back or seek an in-person evaluation if the symptoms worsen or if the condition fails to improve as anticipated.  I provided 15 minutes of non-face-to-face time during this encounter.            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  Patient born premature  Modifying factors; parents, BF  Duration  more then 2 years   Severity manageable when takes meds  Hospital admission with suicide attempt denies    Past Psychiatric History: anxiety  Previous Psychotropic Medications: Yes  Zoloft and lexapro Substance Abuse History in the last 12 months:  No.  Consequences of Substance Abuse: NA  Past Medical History:  Past Medical History:  Diagnosis Date   Anxiety    Phreesia 11/28/2020   Depression    Phreesia 11/28/2020    Past Surgical History:  Procedure Laterality Date   ARTHROSCOPIC REPAIR ACL  2016   EYE SURGERY N/A    Phreesia 11/28/2020    Family Psychiatric History: denies. Dad had disability due to strokes  Family History:  Family History  Problem Relation Age of Onset   Hypertension Mother    Diabetes Mother    Hypertension Father     Social History:   Social History   Socioeconomic History   Marital status: Single    Spouse name: Not on file   Number of children: Not on file   Years of education: Not on file   Highest education level: Not on file  Occupational History   Not on file  Tobacco Use   Smoking status: Former    Types: Cigarettes    Quit date: 2018    Years since quitting: 6.2  Smokeless tobacco: Never  Vaping Use   Vaping Use: Every day  Substance and Sexual Activity   Alcohol use: Not Currently    Comment: occasionally   Drug use: Not Currently    Types: Marijuana   Sexual activity: Yes  Other Topics Concern   Not on file  Social History Narrative   Not on file   Social Determinants of Health   Financial Resource Strain: Not on file  Food Insecurity: Not on file  Transportation Needs: Not on file  Physical Activity: Not on file  Stress: Not on file  Social Connections: Not on file    Additional Social History: grew up with parents,she was born premature,  mom struggled to take care of her husband and patient had retention problems born pre mature.   Allergies:  No Known Allergies  Metabolic Disorder Labs: Lab Results  Component Value Date   HGBA1C 5.3 01/03/2021   No results found for: "PROLACTIN" Lab Results  Component Value Date   CHOL 219 (H) 01/03/2021   TRIG 95 01/03/2021   HDL 87 01/03/2021   CHOLHDL 2.5 01/03/2021   LDLCALC 116 (H) 01/03/2021   Lab Results  Component Value Date   TSH 0.724 10/31/2020    Therapeutic Level Labs: No results found for: "LITHIUM" No results found for: "CBMZ" No results found for: "VALPROATE"  Current Medications: Current Outpatient Medications  Medication Sig Dispense Refill   hydrOXYzine (ATARAX) 25 MG tablet Take 1 tablet (25 mg total) by mouth daily as needed for anxiety. 30 tablet 0   mirtazapine (REMERON) 7.5 MG tablet Take 1 tablet (7.5 mg total) by mouth at bedtime. 30 tablet 1   Multiple Vitamin (MULTIVITAMIN WITH MINERALS) TABS tablet Take 1 tablet by mouth daily.     neomycin-polymyxin-hydrocortisone (CORTISPORIN) OTIC solution Place 3 drops into the left ear 4 (four) times daily. X 7 days 10 mL 0   omeprazole (PRILOSEC) 20 MG capsule Take 1 capsule (20 mg total) by mouth daily. 90 capsule 0   ondansetron (ZOFRAN) 4 MG tablet Take 1 tablet (4 mg total) by mouth every 8 (eight) hours as needed for nausea or vomiting. 15 tablet 0   polyethylene glycol powder (GLYCOLAX/MIRALAX) 17 GM/SCOOP powder Take 17 g by mouth 2 (two) times daily as needed. 3350 g 1   No current facility-administered medications for this visit.     Psychiatric Specialty Exam: Review of Systems  Cardiovascular:  Negative for chest pain.  Neurological:  Negative for tremors.  Psychiatric/Behavioral:  Negative for agitation, hallucinations and self-injury.     There were no vitals taken for this visit.There is no height or weight on file to calculate BMI.  General Appearance: Casual  Eye Contact:  Fair   Speech:  Slow  Volume:  Decreased  Mood:  fair  Affect:  Constricted  Thought Process:  Goal Directed  Orientation:  Full (Time, Place, and Person)  Thought Content:  Rumination  Suicidal Thoughts:  No  Homicidal Thoughts:  No  Memory:  Immediate;   Fair  Judgement:  Fair  Insight:  Fair  Psychomotor Activity:  Decreased  Concentration:  Concentration: Fair  Recall:  [employer] of Knowledge:Fair  Language: Fair  Akathisia:  No  Handed:    AIMS (if indicated):  not done  Assets:  Desire for Improvement Financial Resources/Insurance Social Support  ADL's:  Intact  Cognition: WNL  Sleep:  Fair   Screenings: GAD-7    Flowsheet Row Office Visit from 01/30/2022 in [place]  [facility] at [facility] Visit from 10/31/2020 in [place] at [facility]  Total GAD-7 Score 1 10      PHQ2-9    [facility] Visit from 01/30/2022 in [place] at [place] Visit from 01/23/2022 in [place] at [place] Visit from 06/27/2021 in [place] at [place] from 04/06/2021 in [place] at [place] Visit from 01/03/2021 in [place] at [facility]  PHQ-2 Total Score 0 2 0 2 0  PHQ-9 Total Score -- 10 -- 6 0      [facility] ED from 10/09/2022 in [facility] Emergency Department at [facility] Video Visit from 02/22/2022 in [place] at [facility] ED from 12/26/2021 in [place] Emergency Department at [place] No Risk No Risk No Risk       Assessment and Plan: as follows  Prior documentation reviewed  Major depressive disorder moderate to severe single episode; back on remeron, helps continue and reviewed side effects Grief; manageable Generalized anxiety disorder;doing fair with vistaril, continue 25mg  as 50mg  was  sedating prn   Reviewed medications questions addressed provided supportive therapy  Fu 39m.   Merian Capron, MD 3/22/202412:07 PM

## 2023-01-07 ENCOUNTER — Encounter (HOSPITAL_BASED_OUTPATIENT_CLINIC_OR_DEPARTMENT_OTHER): Payer: Self-pay | Admitting: Emergency Medicine

## 2023-01-07 ENCOUNTER — Other Ambulatory Visit: Payer: Self-pay

## 2023-01-07 ENCOUNTER — Emergency Department (HOSPITAL_BASED_OUTPATIENT_CLINIC_OR_DEPARTMENT_OTHER)
Admission: EM | Admit: 2023-01-07 | Discharge: 2023-01-07 | Disposition: A | Discharge status: Home / Self Care | Payer: Self-pay | Attending: Emergency Medicine | Admitting: Emergency Medicine

## 2023-01-07 DIAGNOSIS — Z8616 Personal history of COVID-19: Secondary | ICD-10-CM | POA: Insufficient documentation

## 2023-01-07 DIAGNOSIS — Z20822 Contact with and (suspected) exposure to covid-19: Secondary | ICD-10-CM | POA: Insufficient documentation

## 2023-01-07 DIAGNOSIS — J988 Other specified respiratory disorders: Secondary | ICD-10-CM | POA: Insufficient documentation

## 2023-01-07 DIAGNOSIS — B9789 Other viral agents as the cause of diseases classified elsewhere: Secondary | ICD-10-CM | POA: Insufficient documentation

## 2023-01-07 LAB — RESP PANEL BY RT-PCR (RSV, FLU A&B, COVID)  RVPGX2
Influenza A by PCR: NEGATIVE
Influenza B by PCR: NEGATIVE
Resp Syncytial Virus by PCR: NEGATIVE
SARS Coronavirus 2 by RT PCR: NEGATIVE

## 2023-01-07 LAB — GROUP A STREP BY PCR: Group A Strep by PCR: NOT DETECTED

## 2023-01-07 NOTE — ED Provider Notes (Signed)
[facility] EMERGENCY DEPARTMENT AT [facility] Provider Note   CSN: [id number] Arrival date & time: 01/07/23  4446     History  Chief Complaint  Patient presents with   Cough    Christy Maldonado is a 27 y.o. female.  HPI     27 y/o comes in with chief complaint of sore throat, congestion and bodyaches.  Patient states that she had COVID-19 in January.  She had gone to the church yesterday, and everybody was coughing over there.  She is now feeling unwell and wants to make sure she does not have COVID before she returns to work.  She also has hoarseness in her voice and works in the customer service answering phones.  Home Medications Prior to Admission medications   Medication Sig Start Date End Date Taking? Authorizing Provider  hydrOXYzine (ATARAX) 25 MG tablet Take 1 tablet (25 mg total) by mouth daily as needed for anxiety. 12/20/22   Thresa Ross, MD  mirtazapine (REMERON) 7.5 MG tablet Take 1 tablet (7.5 mg total) by mouth at bedtime. 12/20/22   Thresa Ross, MD  Multiple Vitamin (MULTIVITAMIN WITH MINERALS) TABS tablet Take 1 tablet by mouth daily.    [provider]  neomycin-polymyxin-hydrocortisone (CORTISPORIN) OTIC solution Place 3 drops into the left ear 4 (four) times daily. X 7 days 01/23/22   Margaretann Loveless, PA-C  omeprazole (PRILOSEC) 20 MG capsule Take 1 capsule (20 mg total) by mouth daily. 08/07/22   Rema Fendt, NP  ondansetron (ZOFRAN) 4 MG tablet Take 1 tablet (4 mg total) by mouth every 8 (eight) hours as needed for nausea or vomiting. 10/15/22   Freddy Finner, NP  polyethylene glycol powder (GLYCOLAX/MIRALAX) 17 GM/SCOOP powder Take 17 g by mouth 2 (two) times daily as needed. 10/15/22   Freddy Finner, NP      Allergies    Patient has no known allergies.    Review of Systems   Review of Systems  All other systems reviewed and are negative.   Physical Exam Updated Vital Signs BP (!) 126/99 (BP Location:  Right Arm)   Pulse 87   Temp 98.9 F (37.2 C)   Resp 14   Wt 54.4 kg   LMP 12/19/2022   SpO2 100%   BMI 19.97 kg/m  Physical Exam Vitals and nursing note reviewed.  Constitutional:      Appearance: She is well-developed.  HENT:     Head: Atraumatic.     Mouth/Throat:     Mouth: Mucous membranes are moist.     Pharynx: No oropharyngeal exudate.  Cardiovascular:     Rate and Rhythm: Normal rate.  Pulmonary:     Effort: Pulmonary effort is normal.     Breath sounds: No wheezing or rhonchi.  Musculoskeletal:     Cervical back: Normal range of motion and neck supple.  Skin:    General: Skin is warm and dry.  Neurological:     Mental Status: She is alert and oriented to person, place, and time.     ED Results / Procedures / Treatments   Labs (all labs ordered are listed, but only abnormal results are displayed) Labs Reviewed  GROUP A STREP BY PCR  RESP PANEL BY RT-PCR (RSV, FLU A&B, COVID)  [id number]    EKG None  Radiology No results found.  Procedures Procedures    Medications Ordered in ED Medications - No data to display  ED Course/ Medical Decision Making/ A&P  Medical Decision Making 27 year old female comes in with chief complaint of sore throat.  She is complaining of URI-like symptoms, sore throat and bodyaches.  She had COVID-19 in January.  Currently no fevers, looks like she is not in any distress.  She has no concerning past medical history.  Differential diagnosis includes viral URI, strep pharyngitis less likely given that she has a cough.  From the triage, respiratory viral panel and strep panel sent.  Both are negative.  Stable for discharge at this point.  No indication for x-ray, as there is no focal abnormality.  Final Clinical Impression(s) / ED Diagnoses Final diagnoses:  Viral respiratory illness    Rx / DC Orders ED Discharge Orders     None         Derwood Kaplan, MD 01/07/23 1221

## 2023-01-07 NOTE — ED Triage Notes (Signed)
Pt arrives pov, steady gait with c/o cough, sore throat, body aches since yesterday. Denies fever

## 2023-01-07 NOTE — Discharge Instructions (Signed)
We saw you in the ER for  °We think what you have is a viral syndrome - the treatment for which is symptomatic relief only, and your body will fight the infection off in a few days. °We are prescribing you some meds for pain and fevers. °See your primary care doctor in 1 week if the symptoms dont improve. ° ° °

## 2023-01-08 ENCOUNTER — Telehealth: Payer: Self-pay | Admitting: Nurse Practitioner

## 2023-01-08 DIAGNOSIS — J069 Acute upper respiratory infection, unspecified: Secondary | ICD-10-CM

## 2023-01-08 MED ORDER — PROMETHAZINE-DM 6.25-15 MG/5ML PO SYRP: 5.0000 mL | ORAL_SOLUTION | Freq: Four times a day (QID) | ORAL | 0 refills | Status: DC | PRN

## 2023-01-08 NOTE — Progress Notes (Signed)
Virtual Visit Consent   [name], you are scheduled for a virtual visit with Christy Daphine Deutscher, FNP, a [facility] provider, today.     Just as with appointments in the office, your consent must be obtained to participate.  Your consent will be active for this visit and any virtual visit you may have with one of our providers in the next 365 days.     If you have a MyChart account, a copy of this consent can be sent to you electronically.  All virtual visits are billed to your insurance company just like a traditional visit in the office.    As this is a virtual visit, video technology does not allow for your provider to perform a traditional examination.  This may limit your provider's ability to fully assess your condition.  If your provider identifies any concerns that need to be evaluated in person or the need to arrange testing (such as labs, EKG, etc.), we will make arrangements to do so.     Although advances in technology are sophisticated, we cannot ensure that it will always work on either your end or our end.  If the connection with a video visit is poor, the visit may have to be switched to a telephone visit.  With either a video or telephone visit, we are not always able to ensure that we have a secure connection.     I need to obtain your verbal consent now.   Are you willing to proceed with your visit today? YES   Sylar Jansen has provided verbal consent on 01/08/2023 for a virtual visit (video or telephone).   Christy Daphine Deutscher, FNP   Date: 01/08/2023 12:41 PM   Virtual Visit via Video Note   I, Christy Maldonado, connected with [name] ([id number], 25-Sep-1996) on 01/08/23 at  1:45 PM EDT by a video-enabled telemedicine application and verified that I am speaking with the correct person using two identifiers.  Location: Patient: Virtual Visit Location Patient: Home Provider: Virtual Visit Location Provider:  Mobile   I discussed the limitations of evaluation and management by telemedicine and the availability of in person appointments. The patient expressed understanding and agreed to proceed.    History of Present Illness: Christy Maldonado is a 26 y.o. who identifies as a female who was assigned female at birth, and is being seen today for viral uri.  HPI: Patient went to ER yesterday. She was told she has a viral upper resp  infection. They gave her no prescription meds. Told her to do OTC treatment. She has been taking dayquil and nyquil. Nothing is helping. Feels like her symprotms ar worsening. Still has no fever. Just has cough and body aches.  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    Review of Systems  Constitutional:  Positive for malaise/fatigue. Negative for chills and fever.  HENT:  Positive for congestion.   Respiratory:  Positive for cough. Negative for sputum production and shortness of breath.     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   Patellar dislocation 02/04/2022   Bilateral chronic knee pain 01/30/2022   Anxiety state 11/02/2020   Constipation 11/02/2020   Psychophysiological insomnia 11/02/2020   Dyspepsia 11/02/2020   Tachycardia 11/02/2020   Panic attacks 09/30/2016    Allergies: No Known Allergies Medications:  Current Outpatient Medications:    hydrOXYzine (  ATARAX) 25 MG tablet, Take 1 tablet (25 mg total) by mouth daily as needed for anxiety., Disp: 30 tablet, Rfl: 0   mirtazapine (REMERON) 7.5 MG tablet, Take 1 tablet (7.5 mg total) by mouth at bedtime., Disp: 30 tablet, Rfl: 1   Multiple Vitamin (MULTIVITAMIN WITH MINERALS) TABS tablet, Take 1 tablet by mouth daily., Disp: , Rfl:    neomycin-polymyxin-hydrocortisone (CORTISPORIN) OTIC solution, Place 3 drops into the left ear 4 (four) times daily. X 7 days, Disp: 10 mL, Rfl: 0   omeprazole  (PRILOSEC) 20 MG capsule, Take 1 capsule (20 mg total) by mouth daily., Disp: 90 capsule, Rfl: 0   ondansetron (ZOFRAN) 4 MG tablet, Take 1 tablet (4 mg total) by mouth every 8 (eight) hours as needed for nausea or vomiting., Disp: 15 tablet, Rfl: 0   polyethylene glycol powder (GLYCOLAX/MIRALAX) 17 GM/SCOOP powder, Take 17 g by mouth 2 (two) times daily as needed., Disp: 3350 g, Rfl: 1  Observations/Objective: Patient is well-developed, well-nourished in no acute distress.  Resting comfortably  at home.  Head is normocephalic, atraumatic.  No labored breathing.  Speech is clear and coherent with logical content.  Patient is alert and oriented at baseline.  Rasp voice Deep cough.  Assessment and Plan:  [name] in today with chief complaint of viral uri  1. URI with cough and congestion 1. Take meds as prescribed 2. Use a cool mist humidifier especially during the winter months and when heat has been humid. 3. Use saline nose sprays frequently 4. Saline irrigations of the nose can be very helpful if done frequently.  * 4X daily for 1 week*  * Use of a nettie pot can be helpful with this. Follow directions with this* 5. Drink plenty of fluids 6. Keep thermostat turn down low 7.For any cough or congestion- promethazine DM 8. For fever or aces or pains- take tylenol or ibuprofen appropriate for age and weight.  * for fevers greater than 101 orally you may alternate ibuprofen and tylenol every  3 hours.      Follow Up Instructions: I discussed the assessment and treatment plan with the patient. The patient was provided an opportunity to ask questions and all were answered. The patient agreed with the plan and demonstrated an understanding of the instructions.  A copy of instructions were sent to the patient via MyChart.  The patient was advised to call back or seek an in-person evaluation if the symptoms worsen or if the condition fails to improve as  anticipated.  Time:  I spent 7 minutes with the patient via telehealth technology discussing the above problems/concerns.    Christy Daphine Deutscher, FNP

## 2023-01-08 NOTE — Patient Instructions (Signed)
  Christy Maldonado, thank you for joining Bennie Pierini, FNP for today's virtual visit.  While this provider is not your primary care provider (PCP), if your PCP is located in our provider database this encounter information will be shared with them immediately following your visit.   A [place] MyChart account gives you access to today's visit and all your visits, tests, and labs performed at [facility] " click here if you don't have a [place] MyChart account or go to [email or web address]  Consent: (Patient) Christy Maldonado provided verbal consent for this virtual visit at the beginning of the encounter.  Current Medications:  Current Outpatient Medications:    hydrOXYzine (ATARAX) 25 MG tablet, Take 1 tablet (25 mg total) by mouth daily as needed for anxiety., Disp: 30 tablet, Rfl: 0   mirtazapine (REMERON) 7.5 MG tablet, Take 1 tablet (7.5 mg total) by mouth at bedtime., Disp: 30 tablet, Rfl: 1   Multiple Vitamin (MULTIVITAMIN WITH MINERALS) TABS tablet, Take 1 tablet by mouth daily., Disp: , Rfl:    neomycin-polymyxin-hydrocortisone (CORTISPORIN) OTIC solution, Place 3 drops into the left ear 4 (four) times daily. X 7 days, Disp: 10 mL, Rfl: 0   omeprazole (PRILOSEC) 20 MG capsule, Take 1 capsule (20 mg total) by mouth daily., Disp: 90 capsule, Rfl: 0   ondansetron (ZOFRAN) 4 MG tablet, Take 1 tablet (4 mg total) by mouth every 8 (eight) hours as needed for nausea or vomiting., Disp: 15 tablet, Rfl: 0   polyethylene glycol powder (GLYCOLAX/MIRALAX) 17 GM/SCOOP powder, Take 17 g by mouth 2 (two) times daily as needed., Disp: 3350 g, Rfl: 1   Medications ordered in this encounter:  No orders of the defined types were placed in this encounter.    *If you need refills on other medications prior to your next appointment, please contact your pharmacy*  Follow-Up: Call back or seek an in-person evaluation if the symptoms worsen or if the  condition fails to improve as anticipated.  [facility] [phone]  Other Instructions 1. Take meds as prescribed 2. Use a cool mist humidifier especially during the winter months and when heat has been humid. 3. Use saline nose sprays frequently 4. Saline irrigations of the nose can be very helpful if done frequently.  * 4X daily for 1 week*  * Use of a nettie pot can be helpful with this. Follow directions with this* 5. Drink plenty of fluids 6. Keep thermostat turn down low 7.For any cough or congestion- promethaizne DM 8. For fever or aces or pains- take tylenol or ibuprofen appropriate for age and weight.  * for fevers greater than 101 orally you may alternate ibuprofen and tylenol every  3 hours.      If you have been instructed to have an in-person evaluation today at a local Urgent Care facility, please use the link below. It will take you to a list of all of our available [facility], including address, phone number and hours of operation. Please do not delay care.  [facility]  If you or a family member do not have a primary care provider, use the link below to schedule a visit and establish care. When you choose a [place] primary care physician or advanced practice provider, you gain a long-term partner in health. Find a Primary Care Provider  Learn more about [place]'s in-office and virtual care options: [place] - Get Care Now

## 2023-02-16 ENCOUNTER — Other Ambulatory Visit (HOSPITAL_COMMUNITY): Payer: Self-pay | Admitting: Psychiatry

## 2023-02-26 ENCOUNTER — Other Ambulatory Visit (HOSPITAL_COMMUNITY): Payer: Self-pay | Admitting: Psychiatry

## 2023-03-21 ENCOUNTER — Telehealth (INDEPENDENT_AMBULATORY_CARE_PROVIDER_SITE_OTHER): Payer: Self-pay | Admitting: Psychiatry

## 2023-03-21 ENCOUNTER — Encounter (HOSPITAL_COMMUNITY): Payer: Self-pay | Admitting: Psychiatry

## 2023-03-21 DIAGNOSIS — F411 Generalized anxiety disorder: Secondary | ICD-10-CM

## 2023-03-21 DIAGNOSIS — F321 Major depressive disorder, single episode, moderate: Secondary | ICD-10-CM

## 2023-03-21 DIAGNOSIS — F4321 Adjustment disorder with depressed mood: Secondary | ICD-10-CM

## 2023-03-21 MED ORDER — MIRTAZAPINE 7.5 MG PO TABS: 7.5000 mg | ORAL_TABLET | Freq: Every day | ORAL | 2 refills | Status: DC

## 2023-03-21 NOTE — Progress Notes (Signed)
[facility] Follow up visit   Patient Identification: Christy Maldonado MRN:  [id number] Date of Evaluation:  03/21/2023 Referral Source: primary care Chief Complaint:   No chief complaint on file. Follow up grief, depression  Visit Diagnosis:    ICD-10-CM   1. Current moderate episode of major depressive disorder without prior episode (HCC)  F32.1     2. GAD (generalized anxiety disorder)  F41.1     3. Grief  F43.21     Virtual Visit via Video Note  I connected with Christy Maldonado on 03/21/23 at 10:00 AM EDT by a video enabled telemedicine application and verified that I am speaking with the correct person using two identifiers.  Location: Patient: home Provider: home office   I discussed the limitations of evaluation and management by telemedicine and the availability of in person appointments. The patient expressed understanding and agreed to proceed.      I discussed the assessment and treatment plan with the patient. The patient was provided an opportunity to ask questions and all were answered. The patient agreed with the plan and demonstrated an understanding of the instructions.   The patient was advised to call back or seek an in-person evaluation if the symptoms worsen or if the condition fails to improve as anticipated.  I provided 15 minutes of non-face-to-face time during this encounter.     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  Patient born premature  Modifying factors;  BF  Duration  more then 2 years   Severity better  Hospital admission with suicide attempt denies    Past Psychiatric History: anxiety  Previous Psychotropic Medications: Yes  Zoloft and lexapro Substance Abuse History in the last 12 months:  No.  Consequences of Substance Abuse: NA  Past Medical History:  Past Medical History:  Diagnosis Date   Anxiety    Phreesia 11/28/2020   Depression    Phreesia 11/28/2020    Past Surgical History:  Procedure Laterality Date   ARTHROSCOPIC REPAIR ACL  04-09-2015   EYE SURGERY N/A    Phreesia 11/28/2020    Family Psychiatric History: denies. Dad had disability due to strokes  Family History:  Family History  Problem Relation Age of Onset   Hypertension Mother    Diabetes Mother    Hypertension Father     Social History:   Social History   Socioeconomic History   Marital status: Single    Spouse name: Not on file   Number of children: Not on file   Years of education: Not on file   Highest education level: Not on file  Occupational History   Not on file  Tobacco Use   Smoking status: Former    Types: Cigarettes    Quit date: 04-08-17    Years since quitting: 6.4  Smokeless tobacco: Never  Vaping Use   Vaping Use: Every day  Substance and Sexual Activity   Alcohol use: Not Currently    Comment: occasionally   Drug use: Not Currently    Types: Marijuana   Sexual activity: Yes  Other Topics Concern   Not on file  Social History Narrative   Not on file   Social Determinants of Health   Financial Resource Strain: Not on file  Food Insecurity: Not on file  Transportation Needs: Not on file  Physical Activity: Not on file  Stress: Not on file  Social Connections: Not on file      Allergies:  No Known Allergies  Metabolic Disorder Labs: Lab Results   Component Value Date   HGBA1C 5.3 01/03/2021   No results found for: "PROLACTIN" Lab Results  Component Value Date   CHOL 219 (H) 01/03/2021   TRIG 95 01/03/2021   HDL 87 01/03/2021   CHOLHDL 2.5 01/03/2021   LDLCALC 116 (H) 01/03/2021   Lab Results  Component Value Date   TSH 0.724 10/31/2020    Therapeutic Level Labs: No results found for: "LITHIUM" No results found for: "CBMZ" No results found for: "VALPROATE"  Current Medications: Current Outpatient Medications  Medication Sig Dispense Refill   hydrOXYzine (ATARAX) 25 MG tablet Take 1 tablet (25 mg total) by mouth daily as needed for anxiety. 30 tablet 0   mirtazapine (REMERON) 7.5 MG tablet Take 1 tablet (7.5 mg total) by mouth at bedtime. 30 tablet 2   Multiple Vitamin (MULTIVITAMIN WITH MINERALS) TABS tablet Take 1 tablet by mouth daily.     neomycin-polymyxin-hydrocortisone (CORTISPORIN) OTIC solution Place 3 drops into the left ear 4 (four) times daily. X 7 days 10 mL 0   omeprazole (PRILOSEC) 20 MG capsule Take 1 capsule (20 mg total) by mouth daily. 90 capsule 0   ondansetron (ZOFRAN) 4 MG tablet Take 1 tablet (4 mg total) by mouth every 8 (eight) hours as needed for nausea or vomiting. 15 tablet 0   polyethylene glycol powder (GLYCOLAX/MIRALAX) 17 GM/SCOOP powder Take 17 g by mouth 2 (two) times daily as needed. 3350 g 1   promethazine-dextromethorphan (PROMETHAZINE-DM) 6.25-15 MG/5ML syrup Take 5 mLs by mouth 4 (four) times daily as needed for cough. 118 mL 0   No current facility-administered medications for this visit.     Psychiatric Specialty Exam: Review of Systems  Cardiovascular:  Negative for chest pain.  Neurological:  Negative for tremors.  Psychiatric/Behavioral:  Negative for agitation, hallucinations and self-injury.     There were no vitals taken for this visit.There is no height or weight on file to calculate BMI.  General Appearance: Casual  Eye Contact:  Fair  Speech:  Slow  Volume:   Decreased  Mood:  fair  Affect:  Constricted  Thought Process:  Goal Directed  Orientation:  Full (Time, Place, and Person)  Thought Content:  Rumination  Suicidal Thoughts:  No  Homicidal Thoughts:  No  Memory:  Immediate;   Fair  Judgement:  Fair  Insight:  Fair  Psychomotor Activity:  Decreased  Concentration:  Concentration: Fair  Recall:  [employer] of Knowledge:Fair  Language: Fair  Akathisia:  No  Handed:    AIMS (if indicated):  not done  Assets:  Desire for Improvement Financial Resources/Insurance Social Support  ADL's:  Intact  Cognition: WNL  Sleep:  Fair   Screenings: GAD-7    Flowsheet Row Office Visit from 01/30/2022 in [facility]  at [facility] Office Visit from 10/31/2020 in [facility] at [facility]  Total GAD-7 Score 1 10      PHQ2-9    Flowsheet Row Office Visit from 01/30/2022 in [facility] at [facility] Office Visit from 01/23/2022 in [facility] at [facility] Office Visit from 06/27/2021 in [facility] Primary Care at [facility] Telemedicine from 04/06/2021 in [facility] at [facility] Office Visit from 01/03/2021 in [facility] at [facility]  PHQ-2 Total Score 0 2 0 2 0  PHQ-9 Total Score -- 10 -- 6 0      Flowsheet Row ED from 01/07/2023 in [facility] Emergency Department at [facility] ED from 10/09/2022 in [facility] Emergency Department at [facility] Video Visit from 02/22/2022 in [facility] at [facility]  C-SSRS RISK CATEGORY No Risk No Risk No Risk       Assessment and Plan: as follows  Prior documentation reviewed  Major depressive disorder moderate to severe single episode; doing better with remeron, will cntinue  Grief; manageable continue adding activities  Generalized anxiety disorder; better continue vistaril prn  Reviewed medications questions  addressed provided supportive therapy  Fu 51m.   Thresa Ross, MD 6/21/202410:10 AM

## 2023-04-15 ENCOUNTER — Other Ambulatory Visit: Payer: Self-pay

## 2023-04-15 ENCOUNTER — Emergency Department (HOSPITAL_BASED_OUTPATIENT_CLINIC_OR_DEPARTMENT_OTHER)
Admission: EM | Admit: 2023-04-15 | Discharge: 2023-04-15 | Disposition: A | Discharge status: Home / Self Care | Payer: Self-pay | Attending: Emergency Medicine | Admitting: Emergency Medicine

## 2023-04-15 ENCOUNTER — Encounter (HOSPITAL_BASED_OUTPATIENT_CLINIC_OR_DEPARTMENT_OTHER): Payer: Self-pay | Admitting: Emergency Medicine

## 2023-04-15 ENCOUNTER — Emergency Department (HOSPITAL_BASED_OUTPATIENT_CLINIC_OR_DEPARTMENT_OTHER): Payer: Self-pay | Admitting: Radiology

## 2023-04-15 DIAGNOSIS — G8929 Other chronic pain: Secondary | ICD-10-CM | POA: Insufficient documentation

## 2023-04-15 DIAGNOSIS — M25561 Pain in right knee: Secondary | ICD-10-CM | POA: Insufficient documentation

## 2023-04-15 NOTE — ED Triage Notes (Signed)
Pt arrives to ED with c/o on-going right knee pain x1 month.

## 2023-04-15 NOTE — ED Provider Notes (Signed)
[place] EMERGENCY DEPARTMENT AT [facility] Provider Note   CSN: [id number] Arrival date & time: 04/15/23  8841     History  Chief Complaint  Patient presents with   Knee Pain    Christy Maldonado is a 27 y.o. female.  27 yo F   With a chief complaint of right knee pain.  This has been going on for many years.  She has had knee surgery bilaterally.  Gets pops and clicks and eventually has some swelling.  No recent injury.  Feels like it is more painful than typical.  No fevers.  No rash.   Knee Pain      Home Medications Prior to Admission medications   Medication Sig Start Date End Date Taking? Authorizing Provider  hydrOXYzine (ATARAX) 25 MG tablet Take 1 tablet (25 mg total) by mouth daily as needed for anxiety. 12/20/22   Thresa Ross, MD  mirtazapine (REMERON) 7.5 MG tablet Take 1 tablet (7.5 mg total) by mouth at bedtime. 03/21/23   Thresa Ross, MD  Multiple Vitamin (MULTIVITAMIN WITH MINERALS) TABS tablet Take 1 tablet by mouth daily.    [provider]  neomycin-polymyxin-hydrocortisone (CORTISPORIN) OTIC solution Place 3 drops into the left ear 4 (four) times daily. X 7 days 01/23/22   Margaretann Loveless, PA-C  omeprazole (PRILOSEC) 20 MG capsule Take 1 capsule (20 mg total) by mouth daily. 08/07/22   Rema Fendt, NP  ondansetron (ZOFRAN) 4 MG tablet Take 1 tablet (4 mg total) by mouth every 8 (eight) hours as needed for nausea or vomiting. 10/15/22   Freddy Finner, NP  polyethylene glycol powder (GLYCOLAX/MIRALAX) 17 GM/SCOOP powder Take 17 g by mouth 2 (two) times daily as needed. 10/15/22   Freddy Finner, NP  promethazine-dextromethorphan (PROMETHAZINE-DM) 6.25-15 MG/5ML syrup Take 5 mLs by mouth 4 (four) times daily as needed for cough. 01/08/23   Bennie Pierini, FNP      Allergies    Patient has no known allergies.    Review of Systems   Review of Systems  Physical Exam Updated Vital Signs BP 120/81 (BP  Location: Right Arm)   Pulse 92   Temp 98.6 F (37 C) (Oral)   Resp 18   Ht 5\' 5"  (1.651 m)   Wt 54.4 kg   LMP 04/01/2023 (Approximate)   SpO2 99%   BMI 19.97 kg/m  Physical Exam Vitals and nursing note reviewed.  Constitutional:      General: She is not in acute distress.    Appearance: She is well-developed. She is not diaphoretic.  HENT:     Head: Normocephalic and atraumatic.  Eyes:     Pupils: Pupils are equal, round, and reactive to light.  Cardiovascular:     Rate and Rhythm: Normal rate and regular rhythm.     Heart sounds: No murmur heard.    No friction rub. No gallop.  Pulmonary:     Effort: Pulmonary effort is normal.     Breath sounds: No wheezing or rales.  Abdominal:     General: There is no distension.     Palpations: Abdomen is soft.     Tenderness: There is no abdominal tenderness.  Musculoskeletal:        General: No tenderness.     Cervical back: Normal range of motion and neck supple.     Comments: High riding patella, no obvious appreciable ligamentous laxity.  Some hamstring spasm.  Intact pulse motor and sensation  Skin:  General: Skin is warm and dry.  Neurological:     Mental Status: She is alert and oriented to person, place, and time.  Psychiatric:        Behavior: Behavior normal.     ED Results / Procedures / Treatments   Labs (all labs ordered are listed, but only abnormal results are displayed) Labs Reviewed - No data to display  EKG None  Radiology DG Knee Complete 4 Views Right  Result Date: 04/15/2023 CLINICAL DATA:  knee pain and swelling EXAM: RIGHT KNEE - COMPLETE 4+ VIEW COMPARISON:  02/04/2022. FINDINGS: No acute fracture or dislocation. No aggressive osseous lesion. Postsurgical changes from prior ACL reconstruction noted. Mild asymmetric narrowing of the lateral tibiofemoral compartment. No tibial spiking or significant osteophyte formation. No knee effusion or focal soft tissue swelling. No radiopaque foreign bodies.  IMPRESSION: *Postsurgical changes from prior ACL reconstruction. Mild asymmetric narrowing of the lateral tibiofemoral compartment. Electronically Signed   By: Jules Schick M.D.   On: 04/15/2023 08:21    Procedures Procedures    Medications Ordered in ED Medications - No data to display  ED Course/ Medical Decision Making/ A&P                             Medical Decision Making Amount and/or Complexity of Data Reviewed Radiology: ordered.   27 yo F with a chief complaint of right knee pain.  The patient unfortunately has had trouble with both of her knees, she had torn her ACL and had a surgery done some years ago and since then has had pain and swelling to the knee off and on.  Has had recurrence for the past few days.  Will obtain a plain film.  Immobilized.  Orthopedic follow-up.  Plain film of the right knee independently interpreted by me without fracture or dislocation.  9:23 AM:  I have discussed the diagnosis/risks/treatment options with the patient.  Evaluation and diagnostic testing in the emergency department does not suggest an emergent condition requiring admission or immediate intervention beyond what has been performed at this time.  They will follow up with Ortho. We also discussed returning to the ED immediately if new or worsening sx occur. We discussed the sx which are most concerning (e.g., sudden worsening pain, fever, inability to tolerate by mouth) that necessitate immediate return. Medications administered to the patient during their visit and any new prescriptions provided to the patient are listed below.  Medications given during this visit Medications - No data to display   The patient appears reasonably screen and/or stabilized for discharge and I doubt any other medical condition or other [facility] requiring further screening, evaluation, or treatment in the ED at this time prior to discharge.          Final Clinical Impression(s) / ED Diagnoses Final  diagnoses:  Chronic pain of right knee    Rx / DC Orders ED Discharge Orders     None         Melene Plan, DO 04/15/23 [phone]

## 2023-04-15 NOTE — Discharge Instructions (Signed)
Your x-ray did not show any acute changes. Take 4 over the counter ibuprofen tablets 3 times a day or 2 over-the-counter naproxen tablets twice a day for pain. Also take tylenol 1000mg (2 extra strength) four times a day.    Follow-up with your orthopedic surgeon in the office.

## 2023-05-13 ENCOUNTER — Telehealth: Payer: Self-pay | Admitting: Family

## 2023-05-13 NOTE — Telephone Encounter (Signed)
Called pt and left vm to call office back to schedule appt for dermatologist referral.

## 2023-05-28 ENCOUNTER — Ambulatory Visit: Payer: Self-pay | Admitting: Orthopedic Surgery

## 2023-06-16 ENCOUNTER — Telehealth (HOSPITAL_COMMUNITY): Payer: Self-pay | Admitting: Psychiatry

## 2023-06-16 NOTE — Telephone Encounter (Signed)
Called to inform patient of need to reschedule 06/20/2023 appointment. No answer and voicemail has not been set up.

## 2023-06-20 ENCOUNTER — Telehealth (HOSPITAL_COMMUNITY): Payer: Self-pay | Admitting: Psychiatry

## 2023-06-30 ENCOUNTER — Telehealth (INDEPENDENT_AMBULATORY_CARE_PROVIDER_SITE_OTHER): Payer: Self-pay | Admitting: Psychiatry

## 2023-06-30 ENCOUNTER — Encounter (HOSPITAL_COMMUNITY): Payer: Self-pay | Admitting: Psychiatry

## 2023-06-30 DIAGNOSIS — F411 Generalized anxiety disorder: Secondary | ICD-10-CM

## 2023-06-30 DIAGNOSIS — F321 Major depressive disorder, single episode, moderate: Secondary | ICD-10-CM

## 2023-06-30 DIAGNOSIS — F4321 Adjustment disorder with depressed mood: Secondary | ICD-10-CM

## 2023-06-30 MED ORDER — MIRTAZAPINE 15 MG PO TABS: 15.0000 mg | ORAL_TABLET | Freq: Every day | ORAL | 1 refills | Status: DC

## 2023-06-30 MED ORDER — HYDROXYZINE HCL 25 MG PO TABS: 25.0000 mg | ORAL_TABLET | Freq: Every day | ORAL | 0 refills | Status: DC | PRN

## 2023-06-30 NOTE — Progress Notes (Signed)
[facility] Follow up visit   Patient Identification: Christy Maldonado MRN:  [id number] Date of Evaluation:  06/30/2023 Referral Source: primary care Chief Complaint:   No chief complaint on file. Follow up grief, depression  Visit Diagnosis:  Virtual Visit via Video Note  I connected with Christy Maldonado on 06/30/23 at  4:30 PM EDT by a video enabled telemedicine application and verified that I am speaking with the correct person using two identifiers.  Location: Patient: home Provider: home office   I discussed the limitations of evaluation and management by telemedicine and the availability of in person appointments. The patient expressed understanding and agreed to proceed.    I discussed the assessment and treatment plan with the patient. The patient was provided an opportunity to ask questions and all were answered. The patient agreed with the plan and demonstrated an understanding of the instructions.   The patient was advised to call back or seek an in-person evaluation if the symptoms worsen or if the condition fails to improve as anticipated.  I provided 18 minutes of non-face-to-face time during this encounter.     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  Patient born premature  Modifying factors; not that many  Duration  more then 3  years   Severity anxious   Hospital admission with suicide attempt denies    Past Psychiatric History: anxiety  Previous Psychotropic Medications: Yes  Zoloft and lexapro Substance Abuse History in the last 12 months:  No.  Consequences of Substance Abuse: NA  Past Medical History:  Past Medical History:  Diagnosis Date   Anxiety    Phreesia 11/28/2020   Depression    Phreesia 11/28/2020    Past Surgical History:  Procedure Laterality Date   ARTHROSCOPIC REPAIR ACL  Jul 31, 2015   EYE SURGERY N/A    Phreesia 11/28/2020    Family Psychiatric History: denies. Dad had disability due to strokes  Family History:  Family History  Problem Relation Age of Onset   Hypertension Mother    Diabetes Mother    Hypertension Father     Social History:   Social History   Socioeconomic History   Marital status: Single    Spouse name: Not on file   Number of children: Not on file   Years of education: Not on file   Highest education level: Not on file  Occupational History   Not on file  Tobacco Use   Smoking status: Former    Current packs/day: 0.00    Types: Cigarettes    Quit date: 07/30/17    Years since quitting: 6.7   Smokeless tobacco: Never  Vaping Use   Vaping status: Every Day  Substance and Sexual Activity   Alcohol use: Not Currently    Comment: occasionally   Drug use: Not Currently  Types: Marijuana   Sexual activity: Yes  Other Topics Concern   Not on file  Social History Narrative   Not on file   Social Determinants of Health   Financial Resource Strain: Not on file  Food Insecurity: Not on file  Transportation Needs: Not on file  Physical Activity: Not on file  Stress: Not on file  Social Connections: Not on file      Allergies:  No Known Allergies  Metabolic Disorder Labs: Lab Results  Component Value Date   HGBA1C 5.3 01/03/2021   No results found for: "PROLACTIN" Lab Results  Component Value Date   CHOL 219 (H) 01/03/2021   TRIG  95 01/03/2021   HDL 87 01/03/2021   CHOLHDL 2.5 01/03/2021   LDLCALC 116 (H) 01/03/2021   Lab Results  Component Value Date   TSH 0.724 10/31/2020    Therapeutic Level Labs: No results found for: "LITHIUM" No results found for: "CBMZ" No results found for: "VALPROATE"  Current Medications: Current Outpatient Medications  Medication Sig Dispense Refill   hydrOXYzine (ATARAX) 25 MG tablet Take 1 tablet (25 mg total) by mouth daily as needed for anxiety. 30 tablet 0   mirtazapine (REMERON) 15 MG tablet Take 1 tablet (15 mg total) by mouth at bedtime. 30 tablet 1   Multiple Vitamin (MULTIVITAMIN WITH MINERALS) TABS tablet Take 1 tablet by mouth daily.     neomycin-polymyxin-hydrocortisone (CORTISPORIN) OTIC solution Place 3 drops into the left ear 4 (four) times daily. X 7 days 10 mL 0   omeprazole (PRILOSEC) 20 MG capsule Take 1 capsule (20 mg total) by mouth daily. 90 capsule 0   ondansetron (ZOFRAN) 4 MG tablet Take 1 tablet (4 mg total) by mouth every 8 (eight) hours as needed for nausea or vomiting. 15 tablet 0   polyethylene glycol powder (GLYCOLAX/MIRALAX) 17 GM/SCOOP powder Take 17 g by mouth 2 (two) times daily as needed. 3350 g 1   promethazine-dextromethorphan (PROMETHAZINE-DM) 6.25-15 MG/5ML syrup Take 5 mLs by mouth 4 (four) times daily as needed for cough. 118 mL 0   No current facility-administered medications for this visit.     Psychiatric Specialty Exam: Review of Systems  Cardiovascular:  Negative for chest pain.  Neurological:  Negative for tremors.  Psychiatric/Behavioral:  Negative for agitation, hallucinations and self-injury. The patient is nervous/anxious.     There were no vitals taken for this visit.There is no height or weight on file to calculate BMI.  General Appearance: Casual  Eye Contact:  Fair  Speech:  Slow  Volume:  Decreased  Mood: stressed  Affect:  Constricted  Thought Process:  Goal Directed  Orientation:  Full (Time, Place, and Person)   Thought Content:  Rumination  Suicidal Thoughts:  No  Homicidal Thoughts:  No  Memory:  Immediate;   Fair  Judgement:  Fair  Insight:  Fair  Psychomotor Activity:  Decreased  Concentration:  Concentration: Fair  Recall:  [employer] of Knowledge:Fair  Language: Fair  Akathisia:  No  Handed:    AIMS (if indicated):  not done  Assets:  Desire for Improvement Financial Resources/Insurance Social Support  ADL's:  Intact  Cognition: WNL  Sleep:  Fair   Screenings: GAD-7    Flowsheet Row Office Visit from 01/30/2022 in [facility] at [facility] Office Visit from 10/31/2020 in [facility] at [facility]  Total GAD-7 Score 1 10      PHQ2-9    Flowsheet Row Office Visit  from 01/30/2022 in [facility] at [facility] Office Visit from 01/23/2022 in [facility] at [facility] Office Visit from 06/27/2021 in [facility] at [facility] Telemedicine from 04/06/2021 in [facility] Primary Care at [facility] Office Visit from 01/03/2021 in [facility] Care at [facility]  PHQ-2 Total Score 0 2 0 2 0  PHQ-9 Total Score -- 10 -- 6 0      Flowsheet Row ED from 04/15/2023 in [facility] Emergency Department at [facility] ED from 01/07/2023 in [facility] Emergency Department at [facility] ED from 10/09/2022 in [facility] Emergency Department at [facility]  C-SSRS RISK CATEGORY No Risk No Risk No Risk       Assessment and Plan: as follows  Prior documentation reviewed  Major depressive disorder moderate to severe single episode; subdued increase remeron to 15mg    Grief; somewhat manageable recommend therapy   Generalized anxiety disorder; stress due to BF , she will call to schedule therapy, will restart vistaril for rescue anxiety, take 25mg  every day prn will send to pharmacy, reviewed breathing techniques and to work on panic attacks , schedule  therapy   Fu 43m.   Thresa Ross, MD 9/30/20244:33 PM

## 2023-07-01 ENCOUNTER — Telehealth (HOSPITAL_COMMUNITY): Payer: Self-pay | Admitting: *Deleted

## 2023-07-01 NOTE — Telephone Encounter (Signed)
Patient LVM -- hydroxyzine and mirtazapine to [facility] on [address] not to [address]. I told my doctor the one on [address].   Spoke to  [facility] on  [address]  & they informed the medication has been back in stock  & script forward to  [facility] on  [address] && patient only has to give them her information.  Patient has been informed

## 2023-07-02 ENCOUNTER — Telehealth: Payer: Self-pay | Admitting: Urgent Care

## 2023-07-02 DIAGNOSIS — H0011 Chalazion right upper eyelid: Secondary | ICD-10-CM

## 2023-07-02 MED ORDER — ERYTHROMYCIN 5 MG/GM OP OINT: 1.0000 | TOPICAL_OINTMENT | Freq: Three times a day (TID) | OPHTHALMIC | 0 refills | Status: AC

## 2023-07-02 NOTE — Patient Instructions (Addendum)
Christy Maldonado, thank you for joining Christy Bees, PA for today's virtual visit.  While this provider is not your primary care provider (PCP), if your PCP is located in our provider database this encounter information will be shared with them immediately following your visit.   A [facility] MyChart account gives you access to today's visit and all your visits, tests, and labs performed at [facility] " click here if you don't have a [name] MyChart account or go to [email or web address]  Consent: (Patient) Christy Maldonado provided verbal consent for this virtual visit at the beginning of the encounter.  Current Medications:  Current Outpatient Medications:    erythromycin ophthalmic ointment, Place 1 Application into the right eye 3 (three) times daily., Disp: 3.5 g, Rfl: 0   hydrOXYzine (ATARAX) 25 MG tablet, Take 1 tablet (25 mg total) by mouth daily as needed for anxiety., Disp: 30 tablet, Rfl: 0   mirtazapine (REMERON) 15 MG tablet, Take 1 tablet (15 mg total) by mouth at bedtime., Disp: 30 tablet, Rfl: 1   Multiple Vitamin (MULTIVITAMIN WITH MINERALS) TABS tablet, Take 1 tablet by mouth daily., Disp: , Rfl:    neomycin-polymyxin-hydrocortisone (CORTISPORIN) OTIC solution, Place 3 drops into the left ear 4 (four) times daily. X 7 days, Disp: 10 mL, Rfl: 0   omeprazole (PRILOSEC) 20 MG capsule, Take 1 capsule (20 mg total) by mouth daily., Disp: 90 capsule, Rfl: 0   ondansetron (ZOFRAN) 4 MG tablet, Take 1 tablet (4 mg total) by mouth every 8 (eight) hours as needed for nausea or vomiting., Disp: 15 tablet, Rfl: 0   polyethylene glycol powder (GLYCOLAX/MIRALAX) 17 GM/SCOOP powder, Take 17 g by mouth 2 (two) times daily as needed., Disp: 3350 g, Rfl: 1   promethazine-dextromethorphan (PROMETHAZINE-DM) 6.25-15 MG/5ML syrup, Take 5 mLs by mouth 4 (four) times daily as needed for cough., Disp: 118 mL, Rfl: 0   Medications ordered in this  encounter:  Meds ordered this encounter  Medications   erythromycin ophthalmic ointment    Sig: Place 1 Application into the right eye 3 (three) times daily.    Dispense:  3.5 g    Refill:  0     *If you need refills on other medications prior to your next appointment, please contact your pharmacy*  Follow-Up: Call back or seek an in-person evaluation if the symptoms worsen or if the condition fails to improve as anticipated.  [facility] [phone]  Other Instructions You have chalazion, which is most commonly caused by a virus. Please avoid touching your eye; if you do, [name] YOUR HANDS! I have prescribed erythromycin eye ointment to PREVENT secondary bacterial infection. Use this on the right eye three times daily x 5 days. If the ointment is too hard to administer, hold it in your warm hands for 5 minutes prior and it will act more like a drop. Warm moist washcloths with Laural Benes and Maldonado baby shampoo can be used to gently massage and cleans to eyes. Go to an in person clinic if any fever, eye pain, change in vision occurs. Please read the attached handout under patient instructions.   If you have been instructed to have an in-person evaluation today at a local Urgent Care facility, please use the link below. It will take you to a list of all of our available [facility], including address, phone number and hours of operation. Please do not delay care.  [name]  If  you or a family member do not have a primary care provider, use the link below to schedule a visit and establish care. When you choose a [facility] primary care physician or advanced practice provider, you gain a long-term partner in health. Find a Primary Care Provider  Learn more about [facility]'s in-office and virtual care options: [name] - Get Care Now

## 2023-07-02 NOTE — Progress Notes (Signed)
Virtual Visit Consent   Christy Maldonado, you are scheduled for a virtual visit with a [facility] provider today. Just as with appointments in the office, your consent must be obtained to participate. Your consent will be active for this visit and any virtual visit you may have with one of our providers in the next 365 days. If you have a MyChart account, a copy of this consent can be sent to you electronically.  As this is a virtual visit, video technology does not allow for your provider to perform a traditional examination. This may limit your provider's ability to fully assess your condition. If your provider identifies any concerns that need to be evaluated in person or the need to arrange testing (such as labs, EKG, etc.), we will make arrangements to do so. Although advances in technology are sophisticated, we cannot ensure that it will always work on either your end or our end. If the connection with a video visit is poor, the visit may have to be switched to a telephone visit. With either a video or telephone visit, we are not always able to ensure that we have a secure connection.  By engaging in this virtual visit, you consent to the provision of healthcare and authorize for your insurance to be billed (if applicable) for the services provided during this visit. Depending on your insurance coverage, you may receive a charge related to this service.  Christy Maldonado need to obtain your verbal consent now. Are you willing to proceed with your visit today? Christy Maldonado has provided verbal consent on 07/02/2023 for a virtual visit (video or telephone). Maretta Bees, PA  Date: 07/02/2023 11:00 AM  Virtual Visit via Video Note   Christy Maldonado, Christy Maldonado, connected with  Christy Maldonado  ([id number], May 02, 1996) on 07/02/23 at 11:00 AM EDT by a video-enabled telemedicine application and verified that Christy Maldonado am speaking with the correct person using two  identifiers.  Location: Patient: Virtual Visit Location Patient: Home Provider: Virtual Visit Location Provider: Home Office   Christy Maldonado discussed the limitations of evaluation and management by telemedicine and the availability of in person appointments. The patient expressed understanding and agreed to proceed.    History of Present Illness: Christy Maldonado is a 27 y.o. who identifies as a female who was assigned female at birth, and is being seen today for R eye concerns.  HPI: 27yo female presents today with concern of a painful bump to the R upper eyelid. This started 24 hours ago. She did have a subconjunctival hemorrhage to the R lateral eye roughly one week ago, but states this is nearly resolved. States the bump on the medial aspect of upper R eyelid is somewhat uncomfortable and somewhat itchy. Denies blurred vision or photophobia. No fever. No drainage. No tx tried.     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   Patellar dislocation 02/04/2022   Bilateral chronic knee pain 01/30/2022   Anxiety state 11/02/2020   Constipation 11/02/2020   Psychophysiological insomnia 11/02/2020   Dyspepsia 11/02/2020   Tachycardia 11/02/2020   Panic attacks 09/30/2016    Allergies: No Known Allergies Medications:  Current Outpatient Medications:    erythromycin ophthalmic ointment, Place 1 Application into the right eye 3 (three) times daily., Disp: 3.5 g, Rfl: 0   hydrOXYzine (ATARAX) 25 MG tablet, Take 1 tablet (25 mg total) by mouth daily as needed for anxiety., Disp: 30 tablet,  Rfl: 0   mirtazapine (REMERON) 15 MG tablet, Take 1 tablet (15 mg total) by mouth at bedtime., Disp: 30 tablet, Rfl: 1   Multiple Vitamin (MULTIVITAMIN WITH MINERALS) TABS tablet, Take 1 tablet by mouth daily., Disp: , Rfl:    neomycin-polymyxin-hydrocortisone (CORTISPORIN) OTIC solution, Place 3 drops into the left ear 4 (four) times  daily. X 7 days, Disp: 10 mL, Rfl: 0   omeprazole (PRILOSEC) 20 MG capsule, Take 1 capsule (20 mg total) by mouth daily., Disp: 90 capsule, Rfl: 0   ondansetron (ZOFRAN) 4 MG tablet, Take 1 tablet (4 mg total) by mouth every 8 (eight) hours as needed for nausea or vomiting., Disp: 15 tablet, Rfl: 0   polyethylene glycol powder (GLYCOLAX/MIRALAX) 17 GM/SCOOP powder, Take 17 g by mouth 2 (two) times daily as needed., Disp: 3350 g, Rfl: 1   promethazine-dextromethorphan (PROMETHAZINE-DM) 6.25-15 MG/5ML syrup, Take 5 mLs by mouth 4 (four) times daily as needed for cough., Disp: 118 mL, Rfl: 0  Observations/Objective: Patient is well-developed, well-nourished in no acute distress.  Resting comfortably at home.  Head is normocephalic, atraumatic.  No labored breathing.  Speech is clear and coherent with logical content.  Patient is alert and oriented at baseline.  Lateral aspect of R eyeball shows resolving subconjunctival hemorrhage R medial upper eyelid shows mild swelling, minimal erythma, most consistent with chalazion. Christy Maldonado am unable to see a stye on the lid margin. The conjunctiva and sclera otherwise appear unremarkable. Normal EOM.  Assessment and Plan: 1. Chalazion of right upper eyelid  Warm compresses, lid hygiene and erythromycin eye ointment called in.  Follow Up Instructions: Christy Maldonado discussed the assessment and treatment plan with the patient. The patient was provided an opportunity to ask questions and all were answered. The patient agreed with the plan and demonstrated an understanding of the instructions.  A copy of instructions were sent to the patient via MyChart unless otherwise noted below.    The patient was advised to call back or seek an in-person evaluation if the symptoms worsen or if the condition fails to improve as anticipated.  Time:  Christy Maldonado spent 7 minutes with the patient via telehealth technology discussing the above problems/concerns.    Kamali Sakata L Lautaro Koral, PA

## 2023-07-07 ENCOUNTER — Telehealth (HOSPITAL_COMMUNITY): Payer: Self-pay | Admitting: Psychiatry

## 2023-07-07 ENCOUNTER — Encounter (HOSPITAL_COMMUNITY): Payer: Self-pay

## 2023-08-18 ENCOUNTER — Encounter (HOSPITAL_COMMUNITY): Payer: Self-pay

## 2023-08-18 ENCOUNTER — Telehealth (HOSPITAL_COMMUNITY): Payer: Self-pay | Admitting: Psychiatry

## 2023-09-23 ENCOUNTER — Other Ambulatory Visit (HOSPITAL_COMMUNITY): Payer: Self-pay | Admitting: Psychiatry

## 2023-11-02 ENCOUNTER — Other Ambulatory Visit (HOSPITAL_COMMUNITY): Payer: Self-pay | Admitting: Psychiatry

## 2023-11-12 ENCOUNTER — Other Ambulatory Visit (HOSPITAL_COMMUNITY): Payer: Self-pay | Admitting: Psychiatry

## 2023-11-14 NOTE — Telephone Encounter (Signed)
Scheduled for Monday 11/17/2023.

## 2023-11-17 ENCOUNTER — Telehealth (INDEPENDENT_AMBULATORY_CARE_PROVIDER_SITE_OTHER): Payer: Medicaid Other | Admitting: Psychiatry

## 2023-11-17 ENCOUNTER — Encounter (HOSPITAL_COMMUNITY): Payer: Self-pay | Admitting: Psychiatry

## 2023-11-17 DIAGNOSIS — F321 Major depressive disorder, single episode, moderate: Secondary | ICD-10-CM | POA: Diagnosis not present

## 2023-11-17 DIAGNOSIS — F411 Generalized anxiety disorder: Secondary | ICD-10-CM

## 2023-11-17 DIAGNOSIS — F4321 Adjustment disorder with depressed mood: Secondary | ICD-10-CM

## 2023-11-17 MED ORDER — HYDROXYZINE HCL 25 MG PO TABS: 25.0000 mg | ORAL_TABLET | Freq: Every day | ORAL | 1 refills | Status: AC | PRN

## 2023-11-17 MED ORDER — MIRTAZAPINE 15 MG PO TABS: 15.0000 mg | ORAL_TABLET | Freq: Every day | ORAL | 1 refills | Status: DC

## 2023-11-17 NOTE — Progress Notes (Signed)
 [facility] Follow up visit   Patient Identification: Christy Maldonado MRN:  [id number] Date of Evaluation:  11/17/2023 Referral Source: primary care Chief Complaint:   No chief complaint on file. Follow up grief, depression  Visit Diagnosis:  Virtual Visit via Video Note  I connected with [name] on 11/17/23 at 12:15 PM by a video enabled telemedicine application and verified that I am speaking with the correct person using two identifiers.  Location: Patient: home Provider: home office   I discussed the limitations of evaluation and management by telemedicine and the availability of in person appointments. The patient expressed understanding and agreed to proceed.       I discussed the assessment and treatment plan with the patient. The patient was provided an opportunity to ask questions and all were answered. The patient agreed with the plan and demonstrated an understanding of the instructions.   The patient was advised to call back or seek an in-person evaluation if the symptoms worsen or if the condition fails to improve as anticipated.  I provided 18 minutes of non-face-to-face time during this encounter.    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  Patient born premature  Modifying factors; not that many   Duration  more then 3 years   Severity  manageable  Hospital admission with suicide attempt denies    Past  Psychiatric History: anxiety  Previous Psychotropic Medications: Yes  Zoloft and lexapro Substance Abuse History in the last 12 months:  No.  Consequences of Substance Abuse: NA  Past Medical History:  Past Medical History:  Diagnosis Date   Anxiety    Phreesia 11/28/2020   Depression    Phreesia 11/28/2020    Past Surgical History:  Procedure Laterality Date   ARTHROSCOPIC REPAIR ACL  11-28-2014   EYE SURGERY N/A    Phreesia 11/28/2020    Family Psychiatric History: denies. Dad had disability due to strokes  Family History:  Family History  Problem Relation Age of Onset   Hypertension Mother    Diabetes Mother    Hypertension Father     Social History:   Social History   Socioeconomic History   Marital status: Single    Spouse name: Not on file   Number of children: Not on file   Years of education: Not on file   Highest education level: Not on file  Occupational History   Not on file  Tobacco Use   Smoking status: Former    Current packs/day: 0.00    Types: Cigarettes    Quit date: 2016/11/28    Years since quitting: 7.1   Smokeless tobacco: Never  Vaping Use   Vaping status: Every Day  Substance and Sexual Activity   Alcohol use: Not Currently    Comment: occasionally   Drug use: Not Currently    Types: Marijuana   Sexual activity: Yes  Other Topics Concern   Not on file  Social History Narrative   Not on file   Social Drivers of Health   Financial Resource Strain: Not on file  Food Insecurity: Not on file  Transportation Needs: Not on file  Physical Activity: Not on file  Stress: Not on file  Social Connections: Not on file      Allergies:  No Known Allergies  Metabolic Disorder Labs: Lab Results  Component Value Date   HGBA1C 5.3 01/03/2021   No results found for: "PROLACTIN" Lab Results  Component Value Date   CHOL 219 (H) 01/03/2021   TRIG 95 01/03/2021   HDL 87 01/03/2021   CHOLHDL 2.5 01/03/2021   LDLCALC 116 (H) 01/03/2021    Lab Results  Component Value Date   TSH 0.724 10/31/2020    Therapeutic Level Labs: No results found for: "LITHIUM" No results found for: "CBMZ" No results found for: "VALPROATE"  Current Medications: Current Outpatient Medications  Medication Sig Dispense Refill   erythromycin ophthalmic ointment Place 1 Application into the right eye 3 (three) times daily. 3.5 g 0   hydrOXYzine (ATARAX) 25 MG tablet Take 1 tablet (25 mg total) by mouth daily as needed for anxiety. 30 tablet 1   mirtazapine (REMERON) 15 MG tablet Take 1 tablet (15 mg total) by mouth at bedtime. 30 tablet 1   Multiple Vitamin (MULTIVITAMIN WITH MINERALS) TABS tablet Take 1 tablet by mouth daily.     neomycin-polymyxin-hydrocortisone (CORTISPORIN) OTIC solution Place 3 drops into the left ear 4 (four) times daily. X 7 days 10 mL 0   omeprazole (PRILOSEC) 20 MG capsule Take 1 capsule (20 mg total) by mouth daily. 90 capsule 0   ondansetron (ZOFRAN) 4 MG tablet Take 1 tablet (4 mg total) by mouth every 8 (eight) hours as needed for nausea or vomiting. 15 tablet 0   polyethylene glycol powder (GLYCOLAX/MIRALAX) 17 GM/SCOOP powder Take 17 g by mouth 2 (two) times daily as needed. 3350 g 1   promethazine-dextromethorphan (PROMETHAZINE-DM) 6.25-15 MG/5ML syrup Take 5 mLs by mouth 4 (four) times daily as needed for cough. 118 mL 0   No current facility-administered medications for this visit.     Psychiatric Specialty Exam: Review of Systems  Cardiovascular:  Negative for chest pain.  Neurological:  Negative for tremors.  Psychiatric/Behavioral:  Negative for agitation, hallucinations and self-injury.     There were no vitals taken for this visit.There is no height or weight on file to calculate BMI.  General Appearance: Casual  Eye Contact:  Fair  Speech:  Slow  Volume:  Decreased  Mood:  fair  Affect:  Constricted  Thought Process:  Goal Directed  Orientation:  Full (Time, Place, and Person)  Thought Content:   Rumination  Suicidal Thoughts:  No  Homicidal Thoughts:  No  Memory:  Immediate;   Fair  Judgement:  Fair  Insight:  Fair  Psychomotor Activity:  Decreased  Concentration:  Concentration: Fair  Recall:  [employer] of Knowledge:Fair  Language: Fair  Akathisia:  No  Handed:    AIMS (if indicated):  not done  Assets:  Desire for Improvement Financial Resources/Insurance Social Support  ADL's:  Intact  Cognition: WNL  Sleep:  Fair   Screenings: GAD-7    Flowsheet Row Office Visit from 01/30/2022 in [facility] at [facility] Visit from 10/31/2020 in [facility] at [facility]  Total GAD-7 Score 1 10      PHQ2-9    Flowsheet Row Office Visit from  01/30/2022 in [facility] Care at [facility] Office Visit from 01/23/2022 in [facility] Outpatient Behavioral Health at [facility] Office Visit from 06/27/2021 in [facility] Primary Care at [facility] Telemedicine from 04/06/2021 in [facility] Primary Care at [facility] Office Visit from 01/03/2021 in [facility] Primary Care at [facility]  PHQ-2 Total Score 0 2 0 2 0  PHQ-9 Total Score -- 10 -- 6 0      Flowsheet Row Video Visit from 06/30/2023 in [facility] Outpatient Behavioral Health at [facility] ED from 04/15/2023 in [facility] Emergency Department at [facility] ED from 01/07/2023 in [facility] Emergency Department at [facility]  C-SSRS RISK CATEGORY No Risk No Risk No Risk       Assessment and Plan: as follows  Prior documentation reviewed  Major depressive disorder moderate to severe single episode; with grief but handling it, recommend therapy, she states not to increase med, continue remeron 15mg . Refill sent   Grief;  provided supportive therpay, continue remeron and consider grief   Generalized anxiety disorder; manageable, continue vistaril prn and coping skills  Says getting BF out of picture has helped it was  stressful  Call earlier if needed   Fu 20m.   Thresa Ross, MD 2/17/202512:17 PM

## 2024-01-07 IMAGING — MR MR KNEE*R* W/O CM
4 of 7 series · 19 of 40 positions shown · non-contrast
Comparison: None Available.

CLINICAL DATA: Chronic right knee pain. History of prior right knee
surgery approximately 10 years ago.

EXAM:
MRI OF THE RIGHT KNEE WITHOUT CONTRAST
TECHNIQUE: Multiplanar, multisequence MR imaging of the knee was performed. No
intravenous contrast was administered.

[Series 3: T2 fat-sat · axial · 4.0mm · 0.29mm/px · z∈[-119,-14]mm · 4 of 30 slices shown]
[im 1/30]
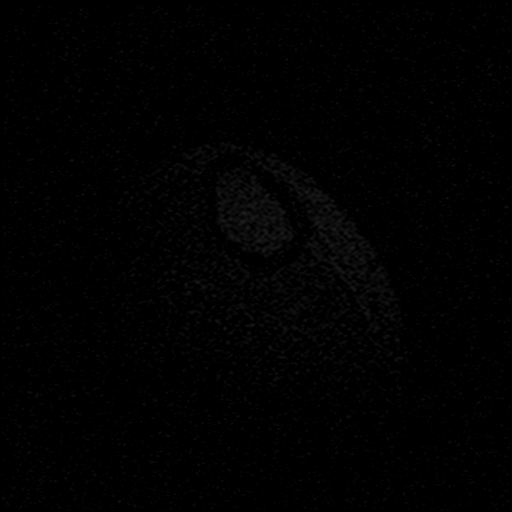
[im 5/30]
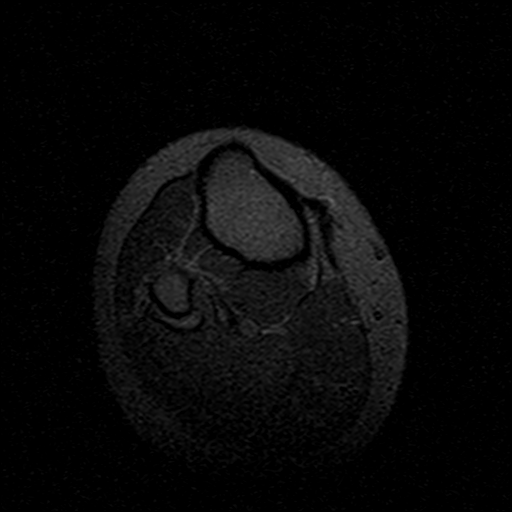
[im 17/30]
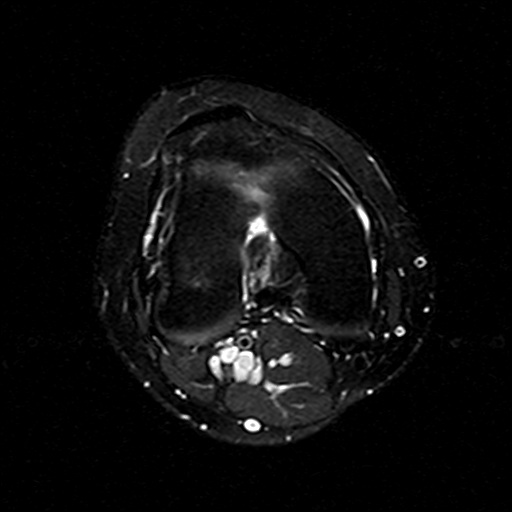
[im 25/30]
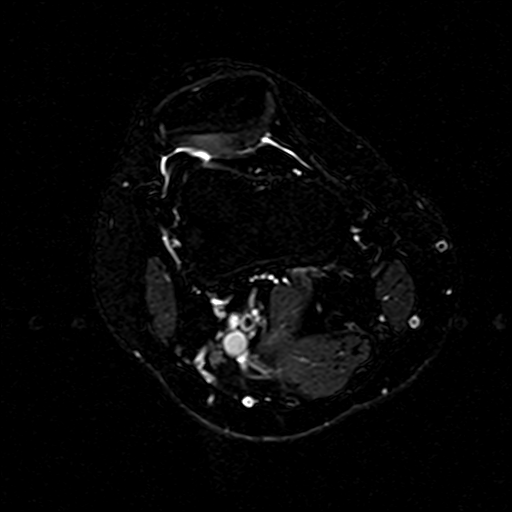

[Series 6: PD fat-sat · coronal · 3.0mm · 0.29mm/px · 6 of 26 slices shown (1 of 3)]
[im 1/26]
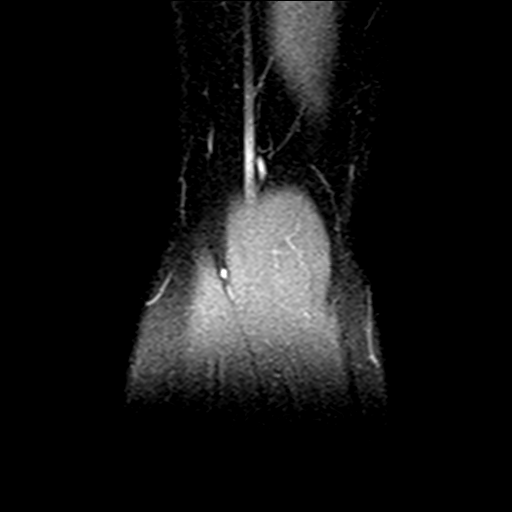
[im 6/26]
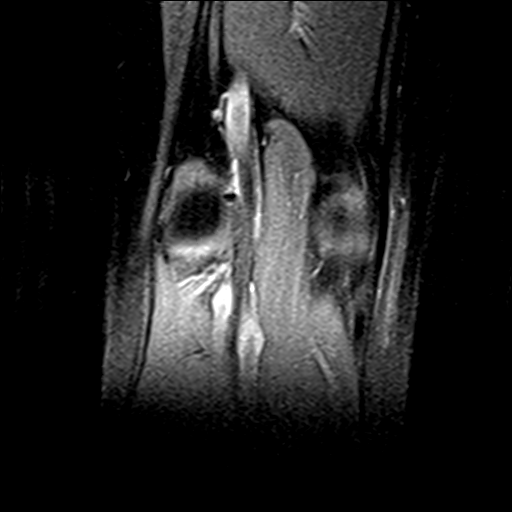
[im 11/26]
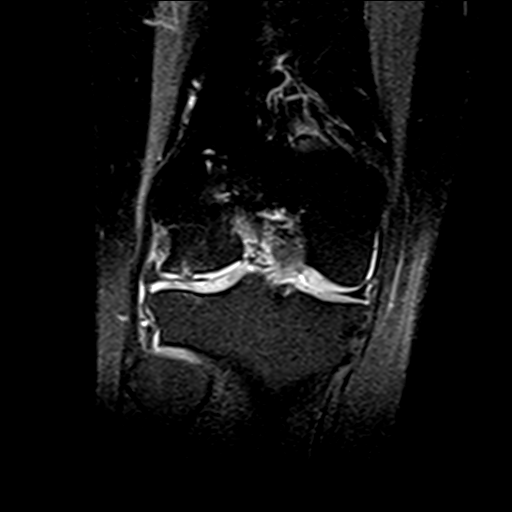
[im 16/26]
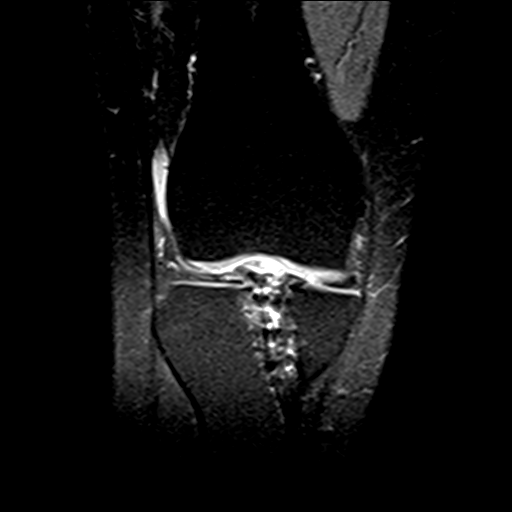
[im 21/26]
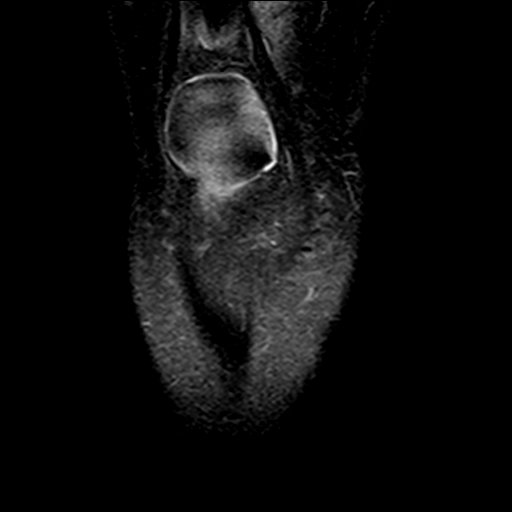
[im 26/26]
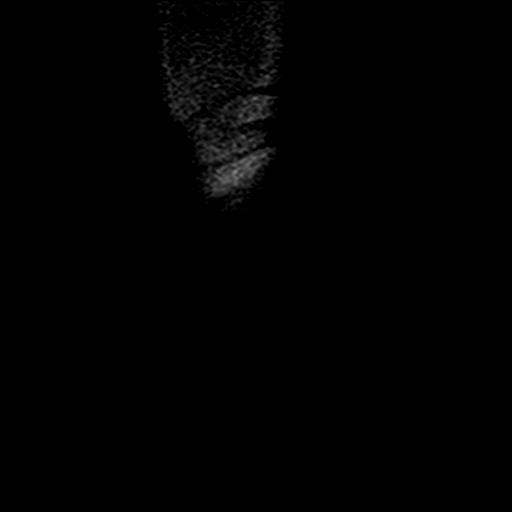

[Series 8: PD fat-sat · sagittal · 3.0mm · 0.29mm/px · 6 of 26 slices shown (2 of 3)]
[im 1/26]
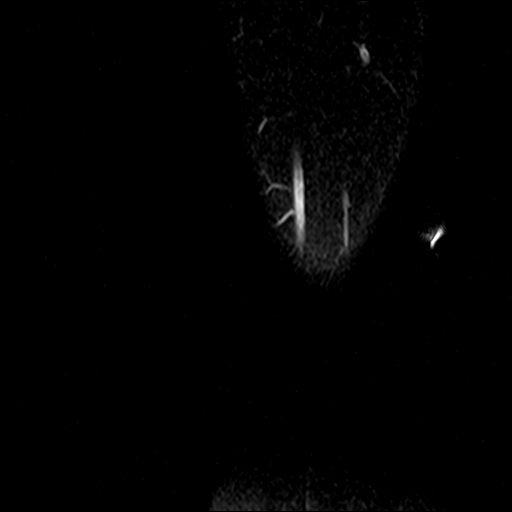
[im 6/26]
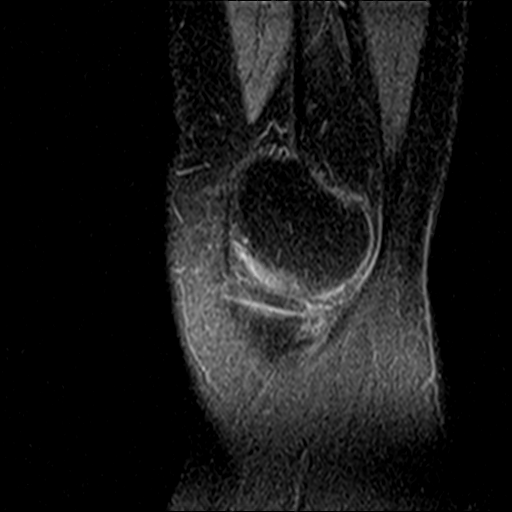
[im 11/26]
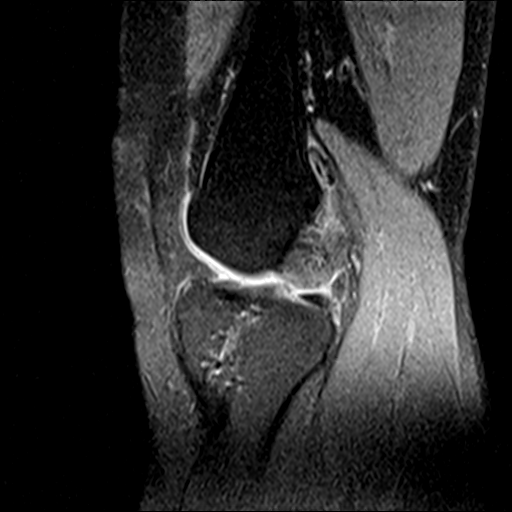
[im 16/26]
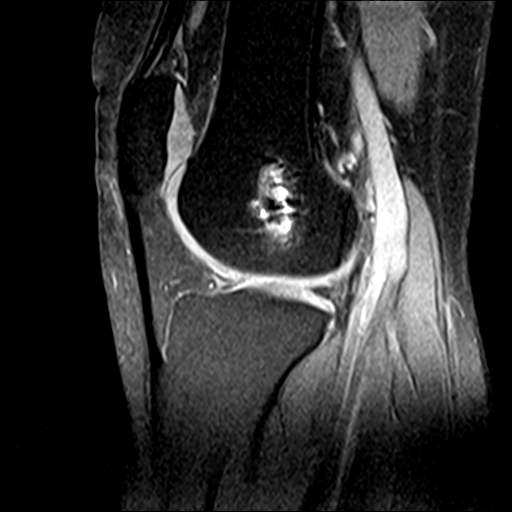
[im 21/26]
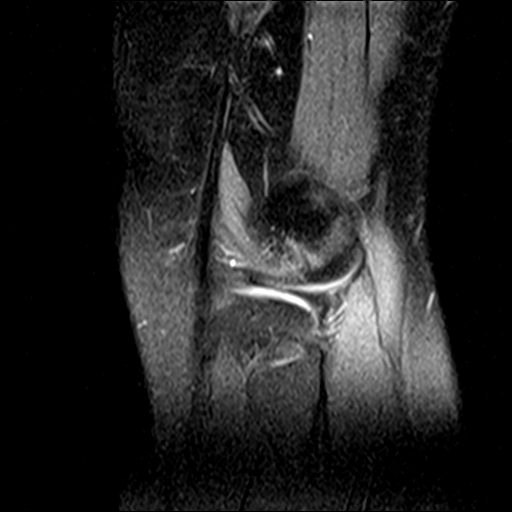
[im 26/26]
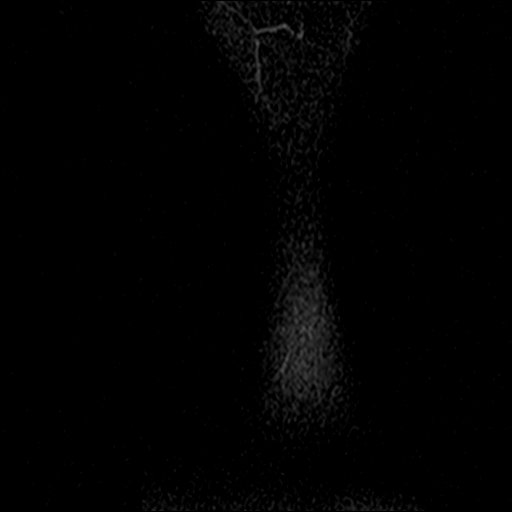

[Series 10: PD fat-sat · oblique · 2.0mm · 0.29mm/px · 3 of 13 slices shown (3 of 3)]
[im 1/13]
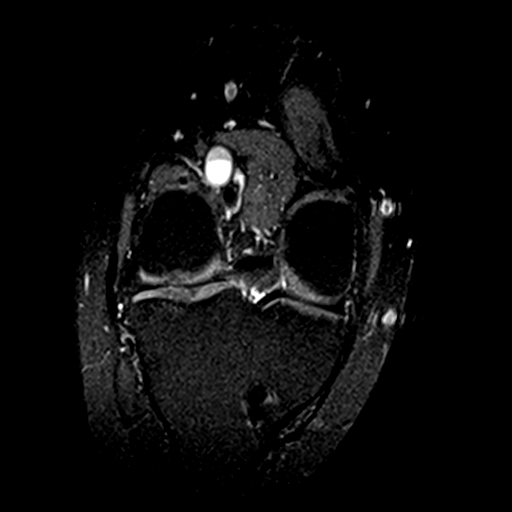
[im 7/13]
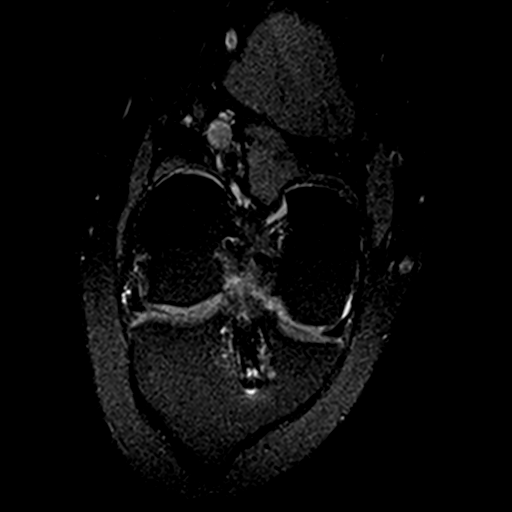
[im 13/13]
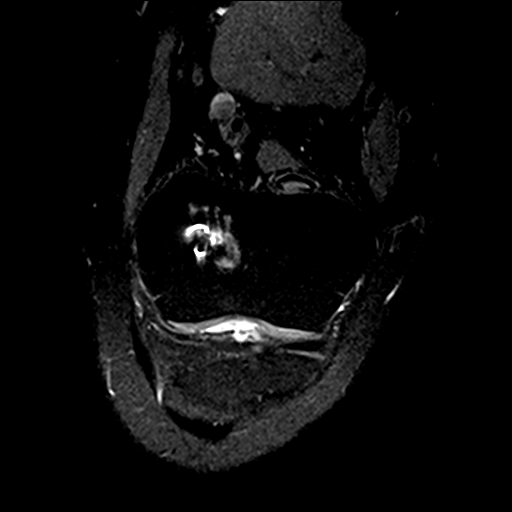

[19 of 40 positions shown; findings below may reference images not displayed]

FINDINGS: MENISCI

Medial meniscus:  Intact.

Lateral meniscus:  Intact.

LIGAMENTS

Cruciates: The patient is status post ACL grafting. The graft is
intact but appears degenerated. There is mild kinking of the graft
is enters the tibial. The PCL is intact.

Collaterals:  Intact.

CARTILAGE

Patellofemoral:  Preserved.

Medial:  Minimally degenerated.

Lateral: Cartilage is thinned and frayed along the tibia and
weight-bearing lateral femoral condyle.

Joint:  Trace amount of joint fluid.

Popliteal Fossa:  No Baker's cyst.

Extensor Mechanism:  Intact.

Bones: No fracture or worrisome lesion. Mild subchondral edema is
seen in the weight-bearing lateral femoral condyle.

Other: None.
IMPRESSION: Status post ACL grafting. The graft appears intact but degenerated.
Mild buckling of the graft is enters the tibial tunnel is noted.

Negative for meniscal tear.

Chondromalacia of the lateral compartment with mild subchondral
edema in the weight-bearing lateral femoral condyle.

## 2024-01-19 ENCOUNTER — Telehealth (INDEPENDENT_AMBULATORY_CARE_PROVIDER_SITE_OTHER): Payer: Medicaid Other | Admitting: Psychiatry

## 2024-01-19 ENCOUNTER — Encounter (HOSPITAL_COMMUNITY): Payer: Self-pay | Admitting: Psychiatry

## 2024-01-19 DIAGNOSIS — F411 Generalized anxiety disorder: Secondary | ICD-10-CM | POA: Diagnosis not present

## 2024-01-19 DIAGNOSIS — F321 Major depressive disorder, single episode, moderate: Secondary | ICD-10-CM | POA: Diagnosis not present

## 2024-01-19 DIAGNOSIS — F4321 Adjustment disorder with depressed mood: Secondary | ICD-10-CM | POA: Diagnosis not present

## 2024-01-19 MED ORDER — MIRTAZAPINE 15 MG PO TABS: 15.0000 mg | ORAL_TABLET | Freq: Every day | ORAL | 1 refills | Status: DC

## 2024-01-19 NOTE — Progress Notes (Signed)
 [facility] Follow up visit   Patient Identification: Christy Maldonado MRN:  [id number] Date of Evaluation:  01/19/2024 Referral Source: primary care Chief Complaint:   No chief complaint on file. Follow up grief, depression  Visit Diagnosis:  Virtual Visit via Video Note  I connected with Christy Maldonado on 01/19/24 at  9:20 AM EDT by a video enabled telemedicine application and verified that I am speaking with the correct person using two identifiers.  Location: Patient: home Provider: home office   I discussed the limitations of evaluation and management by telemedicine and the availability of in person appointments. The patient expressed understanding and agreed to proceed.       I discussed the assessment and treatment plan with the patient. The patient was provided an opportunity to ask questions and all were answered. The patient agreed with the plan and demonstrated an understanding of the instructions.   The patient was advised to call back or seek an in-person evaluation if the symptoms worsen or if the condition fails to improve as anticipated.  I provided 18 minutes of non-face-to-face time during this encounter.     History of Present Illness: Patient is a 28 years old currently single female who is currently living with her boyfriend   Has had a difficult childhood Multiple deaths in family and some recent Remeron  is helping depression but has difficulty falling asleep   Aggravating factors; grand mother death 2020-03-31, BF stresses her.  Patient born premature  Modifying factors; dogs, walks  Duration  more then 3 years   Severity  manageable  Hospital admission with suicide attempt denies    Past Psychiatric History: anxiety  Previous Psychotropic Medications: Yes  Zoloft  and lexapro Substance Abuse History in the last 12 months:  No.  Consequences of Substance Abuse: NA  Past Medical History:  Past Medical History:  Diagnosis  Date  . Anxiety    Phreesia 11/28/2020  . Depression    Phreesia 11/28/2020    Past Surgical History:  Procedure Laterality Date  . ARTHROSCOPIC REPAIR ACL  04/01/2015  . EYE SURGERY N/A    Phreesia 11/28/2020    Family Psychiatric History: denies. Dad had disability due to strokes  Family History:  Family History  Problem Relation Age of Onset  . Hypertension Mother   . Diabetes Mother   . Hypertension Father     Social History:   Social History   Socioeconomic History  . Marital status: Single    Spouse name: Not on file  . Number of children: Not on file  . Years of education: Not on file  . Highest education level: Not on file  Occupational History  . Not on file  Tobacco Use  . Smoking status: Former    Current packs/day: 0.00    Types: Cigarettes    Quit date: Mar 31, 2017    Years since quitting: 7.3  . Smokeless tobacco: Never  Vaping Use  . Vaping status: Every Day  Substance and Sexual Activity  . Alcohol use: Not Currently    Comment: occasionally  . Drug use: Not Currently    Types: Marijuana  . Sexual activity: Yes  Other Topics Concern  . Not on file  Social History Narrative  . Not on file   Social Drivers of Health   Financial Resource Strain: Not on file  Food Insecurity: Not on file  Transportation Needs: Not on file  Physical Activity: Not on file  Stress: Not on file  Social Connections: Not  on file      Allergies:  No Known Allergies  Metabolic Disorder Labs: Lab Results  Component Value Date   HGBA1C 5.3 01/03/2021   No results found for: "PROLACTIN" Lab Results  Component Value Date   CHOL 219 (H) 01/03/2021   TRIG 95 01/03/2021   HDL 87 01/03/2021   CHOLHDL 2.5 01/03/2021   LDLCALC 116 (H) 01/03/2021   Lab Results  Component Value Date   TSH 0.724 10/31/2020    Therapeutic Level Labs: No results found for: "LITHIUM" No results found for: "CBMZ" No results found for: "VALPROATE"  Current Medications: Current  Outpatient Medications  Medication Sig Dispense Refill  . erythromycin  ophthalmic ointment Place 1 Application into the right eye 3 (three) times daily. 3.5 g 0  . hydrOXYzine  (ATARAX ) 25 MG tablet Take 1 tablet (25 mg total) by mouth daily as needed for anxiety. 30 tablet 1  . mirtazapine  (REMERON ) 15 MG tablet Take 1 tablet (15 mg total) by mouth at bedtime. 30 tablet 1  . Multiple Vitamin (MULTIVITAMIN WITH MINERALS) TABS tablet Take 1 tablet by mouth daily.    . neomycin -polymyxin-hydrocortisone (CORTISPORIN) OTIC solution Place 3 drops into the left ear 4 (four) times daily. X 7 days 10 mL 0  . omeprazole  (PRILOSEC) 20 MG capsule Take 1 capsule (20 mg total) by mouth daily. 90 capsule 0  . ondansetron  (ZOFRAN ) 4 MG tablet Take 1 tablet (4 mg total) by mouth every 8 (eight) hours as needed for nausea or vomiting. 15 tablet 0  . polyethylene glycol powder (GLYCOLAX /MIRALAX ) 17 GM/SCOOP powder Take 17 g by mouth 2 (two) times daily as needed. 3350 g 1  . promethazine -dextromethorphan (PROMETHAZINE -DM) 6.25-15 MG/5ML syrup Take 5 mLs by mouth 4 (four) times daily as needed for cough. 118 mL 0   No current facility-administered medications for this visit.     Psychiatric Specialty Exam: Review of Systems  Cardiovascular:  Negative for chest pain.  Neurological:  Negative for tremors.  Psychiatric/Behavioral:  Negative for agitation, hallucinations and self-injury.     There were no vitals taken for this visit.There is no height or weight on file to calculate BMI.  General Appearance: Casual  Eye Contact:  Fair  Speech:  Slow  Volume:  Decreased  Mood:  fair  Affect:  Constricted  Thought Process:  Goal Directed  Orientation:  Full (Time, Place, and Person)  Thought Content:  Rumination  Suicidal Thoughts:  No  Homicidal Thoughts:  No  Memory:  Immediate;   Fair  Judgement:  Fair  Insight:  Fair  Psychomotor Activity:  Decreased  Concentration:  Concentration: Fair  Recall:   [employer] of Knowledge:Fair  Language: Fair  Akathisia:  No  Handed:    AIMS (if indicated):  not done  Assets:  Desire for Improvement Financial Resources/Insurance Social Support  ADL's:  Intact  Cognition: WNL  Sleep:  Fair   Screenings: GAD-7    Flowsheet Row Office Visit from 01/30/2022 in [facility] at [facility] Office Visit from 10/31/2020 in [facility] Care at [facility]  Total GAD-7 Score 1 10      PHQ2-9    Flowsheet Row Office Visit from 01/30/2022 in [facility] Care at [facility] Office Visit from 01/23/2022 in [facility] Office Visit from 06/27/2021 in [facility] Primary Care at [facility] Telemedicine from 04/06/2021 in [facility] Primary Care at [facility] Visit from 01/03/2021  in [facility] at [facility] Total Score 0 2 0 2 0  PHQ-9 Total Score -- 10 -- 6 0      Flowsheet Row Video Visit from 11/17/2023 in [facility] at [facility] Video Visit from 06/30/2023 in [facility] Out[facility] at [facility] ED from 04/15/2023 in [facility] Emergency Department at [facility]  C-SSRS RISK CATEGORY No Risk No Risk No Risk       Assessment and Plan: as follows  Prior documentation reviewed  Major depressive disorder moderate to severe single episode; with grief ;handling it better continue remeron   Grief;  provided supportive therpay, continue remeron  and consider grief   Generalized anxiety disorder; manageable , discussed to keep adding activities Also reviewed sleep hygiene and to go to bed later not at 9P also take remeron  near bed time   Call earlier if needed   Fu 22m.   Wray Heady, MD 4/21/20259:51 AM

## 2024-03-10 ENCOUNTER — Emergency Department (HOSPITAL_BASED_OUTPATIENT_CLINIC_OR_DEPARTMENT_OTHER): Admission: EM | Admit: 2024-03-10 | Discharge: 2024-03-10 | Disposition: A | Discharge status: Home / Self Care

## 2024-03-10 ENCOUNTER — Emergency Department (HOSPITAL_BASED_OUTPATIENT_CLINIC_OR_DEPARTMENT_OTHER): Admitting: Radiology

## 2024-03-10 ENCOUNTER — Other Ambulatory Visit: Payer: Self-pay

## 2024-03-10 ENCOUNTER — Encounter (HOSPITAL_BASED_OUTPATIENT_CLINIC_OR_DEPARTMENT_OTHER): Payer: Self-pay | Admitting: *Deleted

## 2024-03-10 ENCOUNTER — Emergency Department (HOSPITAL_BASED_OUTPATIENT_CLINIC_OR_DEPARTMENT_OTHER)

## 2024-03-10 DIAGNOSIS — M25561 Pain in right knee: Secondary | ICD-10-CM | POA: Insufficient documentation

## 2024-03-10 MED ORDER — KETOROLAC TROMETHAMINE 10 MG PO TABS: 10.0000 mg | ORAL_TABLET | Freq: Four times a day (QID) | ORAL | 0 refills | Status: AC | PRN

## 2024-03-10 MED ORDER — KETOROLAC TROMETHAMINE 15 MG/ML IJ SOLN
15.0000 mg | Freq: Once | INTRAMUSCULAR | Status: AC
  Administered 2024-03-10: 15 mg via INTRAMUSCULAR
  Filled 2024-03-10: qty 1

## 2024-03-10 NOTE — Discharge Instructions (Signed)
 Your x-ray and ultrasound did not show any concerning findings today.  Please take the Toradol  as needed over the next few days and follow-up with the new orthopedic surgeon for reevaluation.  Please return to the emergency department for fevers or worsening symptoms.

## 2024-03-10 NOTE — ED Triage Notes (Signed)
 Pt is here for right knee pain.  Not associated with any recent trauma.  Pt states that she has hx of knee pain and has had a ACL repair surgery in this knee as a teenager.

## 2024-03-10 NOTE — ED Provider Notes (Signed)
 [place] EMERGENCY DEPARTMENT AT [facility] Provider Note   CSN: [id number] Arrival date & time: 03/10/24  4098     History  Chief Complaint  Patient presents with   Knee Pain    Christy Maldonado is a 28 y.o. female.  28 year old female with past medical history of ACL repair in the past presenting to the emergency department today with right knee pain.  Patient states she has been having issues with knee pain on and off for years.  She states that starting yesterday she started having pain in the posterior aspect of her knee.  Denies any recent injuries.  She denies any fevers or chills.  She states that it hurts to stand or walk on this.  She denies any fevers.  Denies a history of IV drug abuse.  She states that she has seen multiple orthopedists in the area for this and has received steroid injections in the past which have worked intermittently.  She came to the emergency department today for further evaluation regarding this.   Knee Pain      Home Medications Prior to Admission medications   Medication Sig Start Date End Date Taking? Authorizing Provider  ketorolac  (TORADOL ) 10 MG tablet Take 1 tablet (10 mg total) by mouth every 6 (six) hours as needed. 03/10/24  Yes Carin Charleston, MD  erythromycin  ophthalmic ointment Place 1 Application into the right eye 3 (three) times daily. 07/02/23   Crain, Whitney L, PA  hydrOXYzine  (ATARAX ) 25 MG tablet Take 1 tablet (25 mg total) by mouth daily as needed for anxiety. 11/17/23   Wray Heady, MD  mirtazapine  (REMERON ) 15 MG tablet Take 1 tablet (15 mg total) by mouth at bedtime. 01/19/24   Wray Heady, MD  Multiple Vitamin (MULTIVITAMIN WITH MINERALS) TABS tablet Take 1 tablet by mouth daily.    [provider]  neomycin -polymyxin-hydrocortisone (CORTISPORIN) OTIC solution Place 3 drops into the left ear 4 (four) times daily. X 7 days 01/23/22   Angelia Kelp, PA-C  omeprazole  (PRILOSEC) 20 MG  capsule Take 1 capsule (20 mg total) by mouth daily. 08/07/22   Senaida Dama, NP  ondansetron  (ZOFRAN ) 4 MG tablet Take 1 tablet (4 mg total) by mouth every 8 (eight) hours as needed for nausea or vomiting. 10/15/22   Lanetta Pion, NP  polyethylene glycol powder (GLYCOLAX /MIRALAX ) 17 GM/SCOOP powder Take 17 g by mouth 2 (two) times daily as needed. 10/15/22   Lanetta Pion, NP  promethazine -dextromethorphan (PROMETHAZINE -DM) 6.25-15 MG/5ML syrup Take 5 mLs by mouth 4 (four) times daily as needed for cough. 01/08/23   Delfina Feller, FNP      Allergies    Patient has no known allergies.    Review of Systems   Review of Systems  Musculoskeletal:        Right knee pain  All other systems reviewed and are negative.   Physical Exam Updated Vital Signs BP 133/87 (BP Location: Right Arm)   Pulse 88   Temp 98.4 F (36.9 C)   Resp 16   LMP 03/06/2024   SpO2 100%  Physical Exam Vitals and nursing note reviewed.  Constitutional:      Appearance: Normal appearance.  Cardiovascular:     Comments: 2+ DP and PT pulses bilaterally Musculoskeletal:     Comments: The patient does have some tenderness over the popliteal fossa on the right, there is normal range of motion of the right knee both actively and passively, no overlying erythema  or warmth noted, no obvious effusion noted.  Compartments are soft.  Neurological:     Mental Status: She is alert.     Comments: Equal strength and sensation throughout the bilateral lower extremities     ED Results / Procedures / Treatments   Labs (all labs ordered are listed, but only abnormal results are displayed) Labs Reviewed - No data to display  EKG None  Radiology US  Venous Img Lower Unilateral Right (DVT) Result Date: 03/10/2024 CLINICAL DATA:  Right knee pain for 2 days. EXAM: RIGHT LOWER EXTREMITY VENOUS DOPPLER ULTRASOUND TECHNIQUE: Gray-scale sonography with compression, as well as color and duplex ultrasound, were performed  to evaluate the deep venous system(s) from the level of the common femoral vein through the popliteal and proximal calf veins. COMPARISON:  None Available. FINDINGS: VENOUS Normal compressibility of the common femoral, superficial femoral, and popliteal veins, as well as the visualized calf veins. Visualized portions of profunda femoral vein and great saphenous vein unremarkable. No filling defects to suggest DVT on grayscale or color Doppler imaging. Doppler waveforms show normal direction of venous flow, normal respiratory plasticity and response to augmentation. Limited views of the contralateral common femoral vein are unremarkable. OTHER None. Limitations: none IMPRESSION: Negative. Electronically Signed   By: Denman Fischer M.D.   On: 03/10/2024 07:58   DG Knee Complete 4 Views Right Result Date: 03/10/2024 CLINICAL DATA:  28 year old female with persistent knee pain. Prior surgery. EXAM: RIGHT KNEE - COMPLETE 4+ VIEW COMPARISON:  Knee radiographs 04/15/2023 and earlier. FINDINGS: Four views at 0715 hours. Postoperative bony changes such as from prior ACL reconstruction. Stable medial and lateral joint spaces and alignment. Lateral compartment degenerative spurring. On the lateral view there is no definite joint effusion, but the patella is abnormally subluxed proximally and distracted from the anterior femur. No superimposed patella fracture identified. No acute osseous abnormality identified elsewhere. IMPRESSION: 1. Widening/displacement of the patella from the patellofemoral articular surface. No fracture identified but consider patellar tendon and/or quadriceps tendon pathology. 2. Otherwise stable prior ACL reconstruction with lateral compartment osteophytosis. Electronically Signed   By: Marlise Simpers M.D.   On: 03/10/2024 07:35    Procedures Procedures    Medications Ordered in ED Medications  ketorolac  (TORADOL ) 15 MG/ML injection 15 mg (15 mg Intramuscular Given 03/10/24 0800)    ED Course/  Medical Decision Making/ A&P                                 Medical Decision Making 28 year old female with past medical history of ACL repair in the past presenting to the emergency department today with right knee pain.  I will further evaluate the patient here with an ultrasound to evaluate for DVT in addition to the x-ray that she had ordered from triage.  Will give the patient Toradol  for pain.  Her exam is not consistent with septic arthritis at this time.  Given the chronicity of this may be due to Baker's cyst or arthritis.  The knee joint is stable and suspicion for acute ligamentous injury is relatively low at this time as well.  The patient states that she is in the process of scheduling appointment with [facility] orthopedics.  I think that if her work appears reassuring that she may be discharged with return precautions to follow-up as an outpatient.  The patient's imaging studies are negative.  She is discharged with return precautions.  Amount and/or Complexity  of Data Reviewed Radiology: ordered.  Risk Prescription drug management.           Final Clinical Impression(s) / ED Diagnoses Final diagnoses:  Right knee pain, unspecified chronicity    Rx / DC Orders ED Discharge Orders          Ordered    ketorolac  (TORADOL ) 10 MG tablet  Every 6 hours PRN        03/10/24 0804              Carin Charleston, MD 03/10/24 [phone]

## 2024-03-24 ENCOUNTER — Encounter (HOSPITAL_COMMUNITY): Payer: Self-pay | Admitting: Psychiatry

## 2024-03-24 ENCOUNTER — Telehealth (HOSPITAL_COMMUNITY): Admitting: Psychiatry

## 2024-03-24 DIAGNOSIS — F321 Major depressive disorder, single episode, moderate: Secondary | ICD-10-CM

## 2024-03-24 DIAGNOSIS — F411 Generalized anxiety disorder: Secondary | ICD-10-CM

## 2024-03-24 DIAGNOSIS — F4321 Adjustment disorder with depressed mood: Secondary | ICD-10-CM | POA: Diagnosis not present

## 2024-03-24 MED ORDER — MIRTAZAPINE 15 MG PO TABS: 15.0000 mg | ORAL_TABLET | Freq: Every day | ORAL | 2 refills | Status: DC

## 2024-03-24 NOTE — Progress Notes (Signed)
 [facility] Follow up visit   Patient Identification: Christy Maldonado MRN:  [id number] Date of Evaluation:  03/24/2024 Referral Source: primary care Chief Complaint:   No chief complaint on file. Follow up grief, depression  Visit Diagnosis:  MDD, GAD, Grief  Virtual Visit via Video Note  I connected with Christy Maldonado on 03/24/24 at  1:00 PM EDT by a video enabled telemedicine application and verified that I am speaking with the correct person using two identifiers.  Location: Patient: home Provider: home office   I discussed the limitations of evaluation and management by telemedicine and the availability of in person appointments. The patient expressed understanding and agreed to proceed.    I discussed the assessment and treatment plan with the patient. The patient was provided an opportunity to ask questions and all were answered. The patient agreed with the plan and demonstrated an understanding of the instructions.   The patient was advised to call back or seek an in-person evaluation if the symptoms worsen or if the condition fails to improve as anticipated.  I provided 18 minutes of non-face-to-face time during this encounter.   History of Present Illness: Patient is a 28 years old currently single female who is currently living with her boyfriend   Has had a difficult childhood  Has had multiple deaths in the family including grandmother but now she is adjusting she believes Remeron  has helped and she is moving forward her sleep and appetite is improved with Remeron  as well  Aggravating factors; grand mother death 2020-03-30, BF stresses her.  Patient born premature  Modifying factors; dogs, walk Duration  more then 3 years   Severity  manageable, improved  Hospital admission with suicide attempt denies    Past Psychiatric History: anxiety  Previous Psychotropic Medications: Yes  Zoloft  and lexapro Substance Abuse History in the last 12  months:  No.  Consequences of Substance Abuse: NA  Past Medical History:  Past Medical History:  Diagnosis Date   Anxiety    Phreesia 11/28/2020   Depression    Phreesia 11/28/2020    Past Surgical History:  Procedure Laterality Date   ARTHROSCOPIC REPAIR ACL  31-Mar-2015   EYE SURGERY N/A    Phreesia 11/28/2020    Family Psychiatric History: denies. Dad had disability due to strokes  Family History:  Family History  Problem Relation Age of Onset   Hypertension Mother    Diabetes Mother    Hypertension Father     Social History:   Social History   Socioeconomic History   Marital status: Single    Spouse name: Not on file   Number of children: Not on file   Years of education: Not on file   Highest education level: Not on file  Occupational History   Not on file  Tobacco Use   Smoking status: Former    Current packs/day: 0.00    Types: Cigarettes    Quit date: 2017/03/30    Years since quitting: 7.4   Smokeless tobacco: Never  Vaping Use   Vaping status: Every Day  Substance and Sexual Activity   Alcohol use: Not Currently    Comment: occasionally   Drug use: Not Currently    Types: Marijuana   Sexual activity: Yes  Other Topics Concern   Not on file  Social History Narrative   Not on file   Social Drivers of Health   Financial Resource Strain: Not on file  Food Insecurity: Not on file  Transportation Needs: Not  on file  Physical Activity: Not on file  Stress: Not on file  Social Connections: Not on file      Allergies:  No Known Allergies  Metabolic Disorder Labs: Lab Results  Component Value Date   HGBA1C 5.3 01/03/2021   No results found for: PROLACTIN Lab Results  Component Value Date   CHOL 219 (H) 01/03/2021   TRIG 95 01/03/2021   HDL 87 01/03/2021   CHOLHDL 2.5 01/03/2021   LDLCALC 116 (H) 01/03/2021   Lab Results  Component Value Date   TSH 0.724 10/31/2020    Therapeutic Level Labs: No results found for: LITHIUM No results  found for: CBMZ No results found for: VALPROATE  Current Medications: Current Outpatient Medications  Medication Sig Dispense Refill   erythromycin  ophthalmic ointment Place 1 Application into the right eye 3 (three) times daily. 3.5 g 0   hydrOXYzine  (ATARAX ) 25 MG tablet Take 1 tablet (25 mg total) by mouth daily as needed for anxiety. 30 tablet 1   ketorolac  (TORADOL ) 10 MG tablet Take 1 tablet (10 mg total) by mouth every 6 (six) hours as needed. 20 tablet 0   mirtazapine  (REMERON ) 15 MG tablet Take 1 tablet (15 mg total) by mouth at bedtime. 30 tablet 2   Multiple Vitamin (MULTIVITAMIN WITH MINERALS) TABS tablet Take 1 tablet by mouth daily.     neomycin -polymyxin-hydrocortisone (CORTISPORIN) OTIC solution Place 3 drops into the left ear 4 (four) times daily. X 7 days 10 mL 0   omeprazole  (PRILOSEC) 20 MG capsule Take 1 capsule (20 mg total) by mouth daily. 90 capsule 0   ondansetron  (ZOFRAN ) 4 MG tablet Take 1 tablet (4 mg total) by mouth every 8 (eight) hours as needed for nausea or vomiting. 15 tablet 0   polyethylene glycol powder (GLYCOLAX /MIRALAX ) 17 GM/SCOOP powder Take 17 g by mouth 2 (two) times daily as needed. 3350 g 1   promethazine -dextromethorphan (PROMETHAZINE -DM) 6.25-15 MG/5ML syrup Take 5 mLs by mouth 4 (four) times daily as needed for cough. 118 mL 0   No current facility-administered medications for this visit.     Psychiatric Specialty Exam: Review of Systems  Cardiovascular:  Negative for chest pain.  Neurological:  Negative for tremors.  Psychiatric/Behavioral:  Negative for agitation, hallucinations and self-injury.     Last menstrual period 03/06/2024.There is no height or weight on file to calculate BMI.  General Appearance: Casual  Eye Contact:  Fair  Speech:  Slow  Volume:  Decreased  Mood:  fair  Affect:  Constricted  Thought Process:  Goal Directed  Orientation:  Full (Time, Place, and Person)  Thought Content:  Rumination  Suicidal  Thoughts:  No  Homicidal Thoughts:  No  Memory:  Immediate;   Fair  Judgement:  Fair  Insight:  Fair  Psychomotor Activity:  Decreased  Concentration:  Concentration: Fair  Recall:  [employer] of Knowledge:Fair  Language: Fair  Akathisia:  No  Handed:    AIMS (if indicated):  not done  Assets:  Desire for Improvement Financial Resources/Insurance Social Support  ADL's:  Intact  Cognition: WNL  Sleep:  Fair   Screenings: GAD-7    Flowsheet Row Office Visit from 01/30/2022 in [facility] at [facility] Office Visit from 10/31/2020 in [facility] at [facility]  Total GAD-7 Score 1 10   PHQ2-9    Flowsheet Row Office Visit from 01/30/2022 in [facility] Primary Care at [facility] Visit from 01/23/2022 in [facility] at [facility] Office Visit from 06/27/2021 in [facility] Primary Care at [facility] Telemedicine from 04/06/2021 in [facility] Primary Care at [facility] Office Visit from 01/03/2021 in [facility] Primary Care at [facility]  PHQ-2 Total Score 0 2 0 2 0  PHQ-9 Total Score -- 10 -- 6 0   Flowsheet Row ED from 03/10/2024 in [facility] Emergency Department at [facility] Video Visit from 11/17/2023 in [facility] at [facility] Video Visit from 06/30/2023 in [facility] at [facility]  C-SSRS RISK CATEGORY No Risk No Risk No Risk    Assessment and Plan: as follows Prior documentation reviewed  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  She is seldom taking hydroxyzine    Follow-up in 3 months or earlier if needed renewed medication which we will do  Jackey Flight,  MD 6/25/202512:58 PM

## 2024-04-03 ENCOUNTER — Telehealth: Admitting: Family Medicine

## 2024-04-03 DIAGNOSIS — B354 Tinea corporis: Secondary | ICD-10-CM | POA: Diagnosis not present

## 2024-04-03 MED ORDER — KETOCONAZOLE 2 % EX SHAM: 1.0000 | MEDICATED_SHAMPOO | CUTANEOUS | 0 refills | Status: DC

## 2024-04-03 NOTE — Progress Notes (Signed)
 Virtual Visit Consent   Christy Maldonado, you are scheduled for a virtual visit with a [place] provider today. Just as with appointments in the office, your consent must be obtained to participate. Your consent will be active for this visit and any virtual visit you may have with one of our providers in the next 365 days. If you have a MyChart account, a copy of this consent can be sent to you electronically.  As this is a virtual visit, video technology does not allow for your provider to perform a traditional examination. This may limit your provider's ability to fully assess your condition. If your provider identifies any concerns that need to be evaluated in person or the need to arrange testing (such as labs, EKG, etc.), we will make arrangements to do so. Although advances in technology are sophisticated, we cannot ensure that it will always work on either your end or our end. If the connection with a video visit is poor, the visit may have to be switched to a telephone visit. With either a video or telephone visit, we are not always able to ensure that we have a secure connection.  By engaging in this virtual visit, you consent to the provision of healthcare and authorize for your insurance to be billed (if applicable) for the services provided during this visit. Depending on your insurance coverage, you may receive a charge related to this service.  I need to obtain your verbal consent now. Are you willing to proceed with your visit today? Christy Maldonado has provided verbal consent on 04/03/2024 for a virtual visit (video or telephone). Roosvelt Mater, [place]  Date: 04/03/2024 3:33 PM   Virtual Visit via Video Note   I, Roosvelt Mater, connected with  Christy Maldonado  ([id number], [birth year]) on 04/03/24 at  3:30 PM EDT by a video-enabled telemedicine application and verified that I am speaking with the correct person using two  identifiers.  Location: Patient: Virtual Visit Location Patient: Moving car as passenger  Provider: Virtual Visit Location Provider: Home Office   I discussed the limitations of evaluation and management by telemedicine and the availability of in person appointments. The patient expressed understanding and agreed to proceed.    History of Present Illness: Christy Maldonado is a 28 y.o. who identifies as a female who was assigned female at birth, and is being seen today for c/o a week ago she had some circular spots in her skin.  Pt states the spots are not itchy.  Pt states spots are on her stomach, lower back and thighs and calf.  Pt states no one around her has these spots. Pt states she has a friend who has cats but is unsure if her friends cat has ring worm.   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   Patellar dislocation 02/04/2022   Bilateral chronic knee pain 01/30/2022   Anxiety state 11/02/2020   Constipation 11/02/2020   Psychophysiological insomnia 11/02/2020   Dyspepsia 11/02/2020   Tachycardia 11/02/2020   Panic attacks 09/30/2016    Allergies: No Known Allergies Medications:  Current Outpatient Medications:    [START ON 04/05/2024] ketoconazole  (NIZORAL ) 2 % shampoo, Apply 1 Application topically 2 (two) times a week., Disp: 120 mL, Rfl: 0   erythromycin  ophthalmic ointment, Place 1 Application into the right eye 3 (three) times daily., Disp: 3.5 g, Rfl: 0   hydrOXYzine  (ATARAX ) 25 MG  tablet, Take 1 tablet (25 mg total) by mouth daily as needed for anxiety., Disp: 30 tablet, Rfl: 1   ketorolac  (TORADOL ) 10 MG tablet, Take 1 tablet (10 mg total) by mouth every 6 (six) hours as needed., Disp: 20 tablet, Rfl: 0   mirtazapine  (REMERON ) 15 MG tablet, Take 1 tablet (15 mg total) by mouth at bedtime., Disp: 30 tablet, Rfl: 2   Multiple Vitamin (MULTIVITAMIN WITH MINERALS) TABS tablet, Take 1  tablet by mouth daily., Disp: , Rfl:    neomycin -polymyxin-hydrocortisone (CORTISPORIN) OTIC solution, Place 3 drops into the left ear 4 (four) times daily. X 7 days, Disp: 10 mL, Rfl: 0   omeprazole  (PRILOSEC) 20 MG capsule, Take 1 capsule (20 mg total) by mouth daily., Disp: 90 capsule, Rfl: 0   ondansetron  (ZOFRAN ) 4 MG tablet, Take 1 tablet (4 mg total) by mouth every 8 (eight) hours as needed for nausea or vomiting., Disp: 15 tablet, Rfl: 0   polyethylene glycol powder (GLYCOLAX /MIRALAX ) 17 GM/SCOOP powder, Take 17 g by mouth 2 (two) times daily as needed., Disp: 3350 g, Rfl: 1   promethazine -dextromethorphan (PROMETHAZINE -DM) 6.25-15 MG/5ML syrup, Take 5 mLs by mouth 4 (four) times daily as needed for cough., Disp: 118 mL, Rfl: 0  Observations/Objective: Patient is well-developed, well-nourished in no acute distress.  Resting comfortably at home.  Head is normocephalic, atraumatic.  No labored breathing.  Speech is clear and coherent with logical content.  Patient is alert and oriented at baseline.  Several circular rings with central clearing noted to arms and legs   Assessment and Plan: 1. Tinea corporis (Primary) - ketoconazole  (NIZORAL ) 2 % shampoo; Apply 1 Application topically 2 (two) times a week.  Dispense: 120 mL; Refill: 0  -Although terbinafine is first line, her insurance is requiring a prior authorization, therefore, decided to try ketoconazole  instead -Pt was advised to follow up with PCP as she may need an oral option because of wide spread areas -Pt verbalized understanding.   Follow Up Instructions: I discussed the assessment and treatment plan with the patient. The patient was provided an opportunity to ask questions and all were answered. The patient agreed with the plan and demonstrated an understanding of the instructions.  A copy of instructions were sent to the patient via MyChart unless otherwise noted below.    The patient was advised to call back or seek an  in-person evaluation if the symptoms worsen or if the condition fails to improve as anticipated.    Roosvelt Mater, PA-C

## 2024-04-03 NOTE — Patient Instructions (Signed)
 Christy Maldonado, thank you for joining Roosvelt Mater, PA-C for today's virtual visit.  While this provider is not your primary care provider (PCP), if your PCP is located in our provider database this encounter information will be shared with them immediately following your visit.   A [facility] MyChart account gives you access to today's visit and all your visits, tests, and labs performed at [facility]  click here if you don't have a [facility] MyChart account or go to [email or web address]  Consent: (Patient) Christy Maldonado provided verbal consent for this virtual visit at the beginning of the encounter.  Current Medications:  Current Outpatient Medications:    [START ON 04/05/2024] ketoconazole  (NIZORAL ) 2 % shampoo, Apply 1 Application topically 2 (two) times a week., Disp: 120 mL, Rfl: 0   erythromycin  ophthalmic ointment, Place 1 Application into the right eye 3 (three) times daily., Disp: 3.5 g, Rfl: 0   hydrOXYzine  (ATARAX ) 25 MG tablet, Take 1 tablet (25 mg total) by mouth daily as needed for anxiety., Disp: 30 tablet, Rfl: 1   ketorolac  (TORADOL ) 10 MG tablet, Take 1 tablet (10 mg total) by mouth every 6 (six) hours as needed., Disp: 20 tablet, Rfl: 0   mirtazapine  (REMERON ) 15 MG tablet, Take 1 tablet (15 mg total) by mouth at bedtime., Disp: 30 tablet, Rfl: 2   Multiple Vitamin (MULTIVITAMIN WITH MINERALS) TABS tablet, Take 1 tablet by mouth daily., Disp: , Rfl:    neomycin -polymyxin-hydrocortisone (CORTISPORIN) OTIC solution, Place 3 drops into the left ear 4 (four) times daily. X 7 days, Disp: 10 mL, Rfl: 0   omeprazole  (PRILOSEC) 20 MG capsule, Take 1 capsule (20 mg total) by mouth daily., Disp: 90 capsule, Rfl: 0   ondansetron  (ZOFRAN ) 4 MG tablet, Take 1 tablet (4 mg total) by mouth every 8 (eight) hours as needed for nausea or vomiting., Disp: 15 tablet, Rfl: 0   polyethylene glycol powder (GLYCOLAX /MIRALAX ) 17 GM/SCOOP powder, Take  17 g by mouth 2 (two) times daily as needed., Disp: 3350 g, Rfl: 1   promethazine -dextromethorphan (PROMETHAZINE -DM) 6.25-15 MG/5ML syrup, Take 5 mLs by mouth 4 (four) times daily as needed for cough., Disp: 118 mL, Rfl: 0   Medications ordered in this encounter:  Meds ordered this encounter  Medications   ketoconazole  (NIZORAL ) 2 % shampoo    Sig: Apply 1 Application topically 2 (two) times a week.    Dispense:  120 mL    Refill:  0     *If you need refills on other medications prior to your next appointment, please contact your pharmacy*  Follow-Up: Call back or seek an in-person evaluation if the symptoms worsen or if the condition fails to improve as anticipated.  [facility] [phone]  Other Instructions Body Ringworm Body ringworm is an infection of the skin that often causes a ring-shaped rash. Body ringworm is also called tinea corporis. Body ringworm can affect any part of your skin. This condition is easily spread from person to person (is very contagious). What are the causes? This condition is caused by fungi called dermatophytes. The condition develops when these fungi grow out of control on the skin. You can get this condition if you touch a person or animal that has it. You can also get it if you share any items with an infected person or pet. These include: Clothing, bedding, and towels. Brushes or combs. Gym equipment. Any other object that has the fungus on it. What increases the risk?  You are more likely to develop this condition if you: Play sports that involve close physical contact, such as wrestling. Sweat a lot. Live in areas that are hot and humid. Use public showers. Have a weakened disease-fighting system (immune system). What are the signs or symptoms? Symptoms of this condition include: Itchy, raised red spots and bumps. Red scaly patches. A ring-shaped rash. The rash may have: A clear center. Scales or red bumps at its  center. Redness near its borders. Dry and scaly skin on or around it. How is this diagnosed? This condition can usually be diagnosed with a skin exam. A skin scraping may be taken from the affected area and examined under a microscope to see if the fungus is present. How is this treated? This condition may be treated with: An antifungal cream or ointment. An antifungal shampoo. Antifungal medicines. These may be prescribed if your ringworm: Is severe. Keeps coming back or lasts a long time. Follow these instructions at home: Take over-the-counter and prescription medicines only as told by your health care provider. If you were given an antifungal cream or ointment: Use it as told by your health care provider. Wash the infected area and dry it completely before applying the cream or ointment. If you were given an antifungal shampoo: Use it as told by your health care provider. Leave the shampoo on your body for 3-5 minutes before rinsing. While you have a rash: Wear loose clothing to stop clothes from rubbing and irritating it. Wash or change your bed sheets every night. Wash clothes and bed sheets in hot water. Disinfect or throw out items that may be infected. Wash your hands often with soap and water for at least 20 seconds. If soap and water are not available, use hand sanitizer. If your pet has the same infection, take your pet to see a veterinarian for treatment. How is this prevented? Take a bath or shower every day and after every time you work out or play sports. Dry your skin completely after bathing. Wear sandals or shoes in public places and showers. Wash athletic clothes after each use. Do not share personal items with others. Avoid touching red patches of skin on other people. Avoid touching pets that have bald spots. If you touch an animal that has a bald spot, wash your hands. Contact a health care provider if: Your rash continues to spread after 7 days of  treatment. Your rash is not gone in 4 weeks. The area around your rash gets red, warm, tender, and swollen. This information is not intended to replace advice given to you by your health care provider. Make sure you discuss any questions you have with your health care provider. Document Revised: 02/28/2022 Document Reviewed: 02/28/2022 Elsevier Patient Education  2024 Elsevier Inc.   If you have been instructed to have an in-person evaluation today at a local Urgent Care facility, please use the link below. It will take you to a list of all of our available [facility], including address, phone number and hours of operation. Please do not delay care.  [facility]  If you or a family member do not have a primary care provider, use the link below to schedule a visit and establish care. When you choose a [facility] primary care physician or advanced practice provider, you gain a long-term partner in health. Find a Primary Care Provider  Learn more about [facility]'s in-office and virtual care options:  - Get  Care Now

## 2024-04-04 ENCOUNTER — Telehealth: Admitting: Emergency Medicine

## 2024-04-04 DIAGNOSIS — B354 Tinea corporis: Secondary | ICD-10-CM

## 2024-04-04 MED ORDER — KETOCONAZOLE 2 % EX CREA: 1.0000 | TOPICAL_CREAM | Freq: Every day | CUTANEOUS | 0 refills | Status: AC

## 2024-04-04 NOTE — Progress Notes (Signed)
 E Visit for Rash  We are sorry that you are not feeling well. Here is how we plan to help!  I do think your rash could be ringworm. Eczema is also possible but less likely unless you have had a problem with eczema in the past.   I see you had a video appointment yesterday. It looks like she accidentally prescribed a shampoo version of the medicine to use on your rash. I will send in the cream instead of the shampoo to use on your rash. The cream will be for ringworm. You can also use hydrocortisone on the rash if you feel it's more likely eczema.    HOME CARE:  Take cool showers and avoid direct sunlight. Apply cool compress or wet dressings. Take a bath in an oatmeal bath.  Sprinkle content of one Aveeno packet under running faucet with comfortably warm water.  Bathe for 15-20 minutes, 1-2 times daily.  Pat dry with a towel. Do not rub the rash. Use hydrocortisone cream. Take an antihistamine like Benadryl for widespread rashes that itch.  The adult dose of Benadryl is 25-50 mg by mouth 4 times daily. Caution:  This type of medication may cause sleepiness.  Do not drink alcohol, drive, or operate dangerous machinery while taking antihistamines.  Do not take these medications if you have prostate enlargement.  Read package instructions thoroughly on all medications that you take.  GET HELP RIGHT AWAY IF:  Symptoms don't go away after treatment. Severe itching that persists. If you rash spreads or swells. If you rash begins to smell. If it blisters and opens or develops a yellow-brown crust. You develop a fever. You have a sore throat. You become short of breath.  MAKE SURE YOU:  Understand these instructions. Will watch your condition. Will get help right away if you are not doing well or get worse.  Thank you for choosing an e-visit.  Your e-visit answers were reviewed by a board certified advanced clinical practitioner to complete your personal care plan. Depending upon the  condition, your plan could have included both over the counter or prescription medications.  Please review your pharmacy choice. Make sure the pharmacy is open so you can pick up prescription now. If there is a problem, you may contact your provider through [employer] and have the prescription routed to another pharmacy.  Your safety is important to us . If you have drug allergies check your prescription carefully.   For the next 24 hours you can use MyChart to ask questions about today's visit, request a non-urgent call back, or ask for a work or school excuse. You will get an email in the next two days asking about your experience. I hope that your e-visit has been valuable and will speed your recovery.  I have spent 5 minutes in review of e-visit questionnaire, review and updating patient chart, medical decision making and response to patient.   Jon Belt, PhD, FNP-BC

## 2024-04-12 ENCOUNTER — Telehealth: Admitting: Physician Assistant

## 2024-04-12 DIAGNOSIS — L700 Acne vulgaris: Secondary | ICD-10-CM | POA: Diagnosis not present

## 2024-04-12 MED ORDER — DOXYCYCLINE HYCLATE 100 MG PO TABS: 100.0000 mg | ORAL_TABLET | Freq: Two times a day (BID) | ORAL | 0 refills | Status: AC

## 2024-04-12 MED ORDER — CLINDAMYCIN PHOS-BENZOYL PEROX 1.2-5 % EX GEL: CUTANEOUS | 0 refills | Status: AC

## 2024-04-12 NOTE — Progress Notes (Signed)
 E-Visit for Acne   We are sorry that you are experiencing this issue.  Here is how we plan to help!  Based on what you shared with me it looks like you have severe acne.  Acne is a disorder of the hair follicles and oil glands (sebaceous glands). The sebaceous glands secrete oils to keep the skin moist.  When the glands get clogged, it can lead to pimples or cysts.  These cysts may become infected and leave scars. Acne is very common and normally occurs at puberty.  Acne is also inherited.  Your personal care plan consists of the following recommendations:  I recommend that you use a daily cleanser  You might try 2% topical salicylic acid pads or wipes.  Use the pads to daily cleanse your skin.  I have prescribed a topical gel with an antibiotic:  Clindamycin -benzoyl peroxide gel.  This gel should be applied to the affected areas twice a day.  Be sure to read the package insert to understand potential side effects.  I have also prescribed one of the following additional therapies:  Doxycycline  an oral antibiotic 100 mg twice a day for 14 days  If excessive dryness or peeling occurs, reduce dose frequency or concentration of the topical scrubs.  If excessive stinging or burning occurs, remove the topical gel with mild soap and water and resume at a lower dose the next day.  Remember oral antibiotics and topical acne treatments may increase your sensitivity to the sun!  HOME CARE: Do not squeeze pimples because that can often lead to infections, worse acne, and scars. Use a moisturizer that contains retinoid or fruit acids that may inhibit the development of new acne lesions. Although there is not a clear link that foods can cause acne, doctors do believe that too many sweets predispose you to skin problems.  GET HELP RIGHT AWAY IF: If your acne gets worse or is not better within 10 days. If you become depressed. If you become pregnant, discontinue medications and call your  OB/GYN.  MAKE SURE YOU: Understand these instructions. Will watch your condition. Will get help right away if you are not doing well or get worse.  Thank you for choosing an e-visit.  Your e-visit answers were reviewed by a board certified advanced clinical practitioner to complete your personal care plan. Depending upon the condition, your plan could have included both over the counter or prescription medications.  Please review your pharmacy choice. Make sure the pharmacy is open so you can pick up prescription now. If there is a problem, you may contact your provider through [employer] and have the prescription routed to another pharmacy.  Your safety is important to us . If you have drug allergies check your prescription carefully.   For the next 24 hours you can use MyChart to ask questions about today's visit, request a non-urgent call back, or ask for a work or school excuse. You will get an email in the next two days asking about your experience. I hope that your e-visit has been valuable and will speed your recovery.    I have spent 5 minutes in review of e-visit questionnaire, review and updating patient chart, medical decision making and response to patient.   Delon CHRISTELLA Dickinson, PA-C

## 2024-06-23 ENCOUNTER — Encounter (HOSPITAL_COMMUNITY): Payer: Self-pay | Admitting: Psychiatry

## 2024-06-23 ENCOUNTER — Telehealth (HOSPITAL_COMMUNITY): Admitting: Psychiatry

## 2024-06-23 DIAGNOSIS — F321 Major depressive disorder, single episode, moderate: Secondary | ICD-10-CM

## 2024-06-23 DIAGNOSIS — F411 Generalized anxiety disorder: Secondary | ICD-10-CM | POA: Diagnosis not present

## 2024-06-23 DIAGNOSIS — F4321 Adjustment disorder with depressed mood: Secondary | ICD-10-CM | POA: Diagnosis not present

## 2024-06-23 MED ORDER — MIRTAZAPINE 15 MG PO TABS: 15.0000 mg | ORAL_TABLET | Freq: Every day | ORAL | 2 refills | Status: DC

## 2024-06-23 NOTE — Progress Notes (Signed)
 [facility] Follow up visit   Patient Identification: Christy Maldonado MRN:  [id number] Date of Evaluation:  06/23/2024 Referral Source: primary care Chief Complaint:   No chief complaint on file. Follow up grief, depression  Visit Diagnosis:  MDD, GAD, Grief  Virtual Visit via Video Note  I connected with Christy Maldonado on 06/23/24 at  9:20 AM EDT by a video enabled telemedicine application and verified that I am speaking with the correct person using two identifiers.  Location: Patient: home Provider: home office   I discussed the limitations of evaluation and management by telemedicine and the availability of in person appointments. The patient expressed understanding and agreed to proceed.     I discussed the assessment and treatment plan with the patient. The patient was provided an opportunity to ask questions and all were answered. The patient agreed with the plan and demonstrated an understanding of the instructions.   The patient was advised to call back or seek an in-person evaluation if the symptoms worsen or if the condition fails to improve as anticipated.  I provided 16 minutes of non-face-to-face time during this encounter.    History of Present Illness: Patient is a 28 years old currently single female who is currently living with her boyfriend   Has had a difficult childhood  Has had multiple deaths in the family including grandmother On evaluation patient is doing much better she is worried about her living situation they are getting married over there so she is planning to move.  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  Patient born premature  Modifying factors; dogs, walk Duration  more then 3 years   Severity  manageable,  improved  Hospital admission with suicide attempt denies    Past Psychiatric History: anxiety  Previous Psychotropic Medications: Yes  Zoloft  and lexapro Substance Abuse History in the last 12 months:  No.  Consequences of Substance Abuse: NA  Past Medical History:  Past Medical History:  Diagnosis Date   Anxiety    Phreesia 11/28/2020   Depression    Phreesia 11/28/2020    Past Surgical History:  Procedure Laterality Date   ARTHROSCOPIC REPAIR ACL  07/03/2015   EYE SURGERY N/A    Phreesia 11/28/2020    Family Psychiatric History: denies. Dad had disability due to strokes  Family History:  Family History  Problem Relation Age of Onset   Hypertension Mother    Diabetes Mother    Hypertension Father     Social History:   Social History   Socioeconomic History   Marital status: Single    Spouse name: Not on file   Number of children: Not on file   Years of education: Not on file   Highest education level: Not on file  Occupational History   Not on file  Tobacco Use   Smoking status: Former    Current packs/day: 0.00    Types: Cigarettes    Quit date: 07/02/2017    Years since quitting: 7.7   Smokeless tobacco: Never  Vaping Use   Vaping status: Every Day  Substance and Sexual Activity   Alcohol use: Not Currently    Comment: occasionally   Drug use: Not Currently    Types: Marijuana   Sexual activity: Yes  Other Topics Concern  Not on file  Social History Narrative   Not on file   Social Drivers of Health   Financial Resource Strain: Not on file  Food Insecurity: Not on file  Transportation Needs: Not on file  Physical Activity: Not on file  Stress: Not on file  Social Connections: Not on file      Allergies:  No Known Allergies  Metabolic Disorder Labs: Lab Results  Component Value Date   HGBA1C 5.3 01/03/2021   No results found for: PROLACTIN Lab Results  Component Value Date   CHOL 219 (H) 01/03/2021   TRIG 95 01/03/2021   HDL 87  01/03/2021   CHOLHDL 2.5 01/03/2021   LDLCALC 116 (H) 01/03/2021   Lab Results  Component Value Date   TSH 0.724 10/31/2020    Therapeutic Level Labs: No results found for: LITHIUM No results found for: CBMZ No results found for: VALPROATE  Current Medications: Current Outpatient Medications  Medication Sig Dispense Refill   Clindamycin -Benzoyl Per, Refr, gel Apply topically to clean skin twice daily 45 g 0   erythromycin  ophthalmic ointment Place 1 Application into the right eye 3 (three) times daily. 3.5 g 0   hydrOXYzine  (ATARAX ) 25 MG tablet Take 1 tablet (25 mg total) by mouth daily as needed for anxiety. 30 tablet 1   ketoconazole  (NIZORAL ) 2 % cream Apply 1 Application topically daily. 30 g 0   ketorolac  (TORADOL ) 10 MG tablet Take 1 tablet (10 mg total) by mouth every 6 (six) hours as needed. 20 tablet 0   mirtazapine  (REMERON ) 15 MG tablet Take 1 tablet (15 mg total) by mouth at bedtime. 30 tablet 2   Multiple Vitamin (MULTIVITAMIN WITH MINERALS) TABS tablet Take 1 tablet by mouth daily.     omeprazole  (PRILOSEC) 20 MG capsule Take 1 capsule (20 mg total) by mouth daily. 90 capsule 0   promethazine -dextromethorphan (PROMETHAZINE -DM) 6.25-15 MG/5ML syrup Take 5 mLs by mouth 4 (four) times daily as needed for cough. 118 mL 0   No current facility-administered medications for this visit.     Psychiatric Specialty Exam: Review of Systems  Cardiovascular:  Negative for chest pain.  Neurological:  Negative for tremors.  Psychiatric/Behavioral:  Negative for agitation, hallucinations and self-injury.     There were no vitals taken for this visit.There is no height or weight on file to calculate BMI.  General Appearance: Casual  Eye Contact:  Fair  Speech:  Slow  Volume:  Decreased  Mood:  fair  Affect:  Constricted  Thought Process:  Goal Directed  Orientation:  Full (Time, Place, and Person)  Thought Content:  Rumination  Suicidal Thoughts:  No  Homicidal  Thoughts:  No  Memory:  Immediate;   Fair  Judgement:  Fair  Insight:  Fair  Psychomotor Activity:  Decreased  Concentration:  Concentration: Fair  Recall:  [employer] of Knowledge:Fair  Language: Fair  Akathisia:  No  Handed:    AIMS (if indicated):  not done  Assets:  Desire for Improvement Financial Resources/Insurance Social Support  ADL's:  Intact  Cognition: WNL  Sleep:  Fair   Screenings: GAD-7    Flowsheet Row Office Visit from 01/30/2022 in [facility] at [facility] Office Visit from 10/31/2020 in [facility]  Total GAD-7 Score 1 10   PHQ2-9    Flowsheet Row Office Visit from 01/30/2022 in [facility] Office Visit from 01/23/2022 in [facility] at [facility] Office Visit from 06/27/2021 in [facility] Telemedicine from 04/06/2021 in [facility] Office Visit from 01/03/2021 in [facility] at [facility] Total Score 0 2 0 2 0  PHQ-9 Total Score -- 10 -- 6 0   Flowsheet Row ED from 03/10/2024 in [facility] at [facility] Video Visit from 11/17/2023 in [facility] at [facility] Video Visit from 06/30/2023 in [facility] at [facility]  C-SSRS RISK CATEGORY No Risk No Risk No Risk    Assessment and Plan: as follows Prior documentation reviewed  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

## 2024-06-28 ENCOUNTER — Encounter: Payer: Self-pay | Admitting: Physician Assistant

## 2024-06-28 ENCOUNTER — Telehealth: Admitting: Physician Assistant

## 2024-06-28 DIAGNOSIS — U071 COVID-19: Secondary | ICD-10-CM | POA: Diagnosis not present

## 2024-06-28 DIAGNOSIS — J069 Acute upper respiratory infection, unspecified: Secondary | ICD-10-CM

## 2024-06-28 MED ORDER — NAPROXEN 500 MG PO TABS: 500.0000 mg | ORAL_TABLET | Freq: Two times a day (BID) | ORAL | 0 refills | Status: AC

## 2024-06-28 MED ORDER — PROMETHAZINE-DM 6.25-15 MG/5ML PO SYRP: 5.0000 mL | ORAL_SOLUTION | Freq: Four times a day (QID) | ORAL | 0 refills | Status: AC | PRN

## 2024-06-28 MED ORDER — NIRMATRELVIR/RITONAVIR (PAXLOVID)TABLET: 3.0000 | ORAL_TABLET | Freq: Two times a day (BID) | ORAL | 0 refills | Status: AC

## 2024-06-28 MED ORDER — FLUTICASONE PROPIONATE 50 MCG/ACT NA SUSP: 2.0000 | Freq: Every day | NASAL | 0 refills | Status: AC

## 2024-06-28 NOTE — Patient Instructions (Signed)
 Christy Maldonado, thank you for joining Delon CHRISTELLA Dickinson, PA-C for today's virtual visit.  While this provider is not your primary care provider (PCP), if your PCP is located in our provider database this encounter information will be shared with them immediately following your visit.   A [facility] MyChart account gives you access to today's visit and all your visits, tests, and labs performed at [facility]  click here if you don't have a [facility] MyChart account or go to [email or web address]  Consent: (Patient) Christy Maldonado provided verbal consent for this virtual visit at the beginning of the encounter.  Current Medications:  Current Outpatient Medications:    fluticasone (FLONASE) 50 MCG/ACT nasal spray, Place 2 sprays into both nostrils daily., Disp: 16 g, Rfl: 0   naproxen (NAPROSYN) 500 MG tablet, Take 1 tablet (500 mg total) by mouth 2 (two) times daily with a meal., Disp: 30 tablet, Rfl: 0   promethazine -dextromethorphan (PROMETHAZINE -DM) 6.25-15 MG/5ML syrup, Take 5 mLs by mouth 4 (four) times daily as needed., Disp: 118 mL, Rfl: 0   Clindamycin -Benzoyl Per, Refr, gel, Apply topically to clean skin twice daily, Disp: 45 g, Rfl: 0   erythromycin  ophthalmic ointment, Place 1 Application into the right eye 3 (three) times daily., Disp: 3.5 g, Rfl: 0   hydrOXYzine  (ATARAX ) 25 MG tablet, Take 1 tablet (25 mg total) by mouth daily as needed for anxiety., Disp: 30 tablet, Rfl: 1   ketoconazole  (NIZORAL ) 2 % cream, Apply 1 Application topically daily., Disp: 30 g, Rfl: 0   ketorolac  (TORADOL ) 10 MG tablet, Take 1 tablet (10 mg total) by mouth every 6 (six) hours as needed., Disp: 20 tablet, Rfl: 0   mirtazapine  (REMERON ) 15 MG tablet, Take 1 tablet (15 mg total) by mouth at bedtime., Disp: 30 tablet, Rfl: 2   Multiple Vitamin (MULTIVITAMIN WITH MINERALS) TABS tablet, Take 1 tablet by mouth daily., Disp: , Rfl:    omeprazole  (PRILOSEC) 20  MG capsule, Take 1 capsule (20 mg total) by mouth daily., Disp: 90 capsule, Rfl: 0   Medications ordered in this encounter:  Meds ordered this encounter  Medications   promethazine -dextromethorphan (PROMETHAZINE -DM) 6.25-15 MG/5ML syrup    Sig: Take 5 mLs by mouth 4 (four) times daily as needed.    Dispense:  118 mL    Refill:  0    Supervising Provider:   LAMPTEY, PHILIP O [[id number]]   fluticasone (FLONASE) 50 MCG/ACT nasal spray    Sig: Place 2 sprays into both nostrils daily.    Dispense:  16 g    Refill:  0    Supervising Provider:   BLAISE ALEENE KIDD [[id number]]   naproxen (NAPROSYN) 500 MG tablet    Sig: Take 1 tablet (500 mg total) by mouth 2 (two) times daily with a meal.    Dispense:  30 tablet    Refill:  0    Supervising Provider:   BLAISE ALEENE KIDD [[id number]]     *If you need refills on other medications prior to your next appointment, please contact your pharmacy*  Follow-Up: Call back or seek an in-person evaluation if the symptoms worsen or if the condition fails to improve as anticipated.  [facility] [phone]  Care Instructions: Can take to lessen severity (if able): Vit C 500mg  twice daily Quercertin 250-500mg  twice daily Zinc 75-100mg  daily Melatonin 3-6 mg at bedtime Vit D3 1000-2000 IU daily Aspirin 81 mg daily with food Optional: Famotidine 20mg  daily Also can  add tylenol /ibuprofen  as needed for fevers and body aches May add Mucinex or Mucinex DM as needed for cough/congestion   Viral Respiratory Infection A respiratory infection is an illness that affects part of the respiratory system, such as the lungs, nose, or throat. A respiratory infection that is caused by a virus is called a viral respiratory infection. Common types of viral respiratory infections include: A cold. The flu (influenza). A respiratory syncytial virus (RSV) infection. What are the causes? This condition is caused by a virus. The virus may spread through  contact with droplets or direct contact with infected people or their mucus or secretions. The virus may spread from person to person (is contagious). What are the signs or symptoms? Symptoms of this condition include: A stuffy or runny nose. A sore throat or cough. Shortness of breath or difficulty breathing. Yellow or green mucus (sputum). Other symptoms may include: A fever. Sweating or chills. Fatigue. Achy muscles. A headache. How is this diagnosed? This condition may be diagnosed based on: Your symptoms. A physical exam. Testing of secretions from the nose or throat. Chest X-ray. How is this treated? This condition may be treated with medicines, such as: Antiviral medicine. This may shorten the length of time a person has symptoms. Expectorants. These make it easier to cough up mucus. Decongestant nasal sprays. Acetaminophen  or NSAIDs, such as ibuprofen , to relieve fever and pain. Antibiotic medicines are not prescribed for viral infections.This is because antibiotics are designed to kill bacteria. They do not kill viruses. Follow these instructions at home: Managing pain and congestion Take over-the-counter and prescription medicines only as told by your health care provider. If you have a sore throat, gargle with a mixture of salt and water 3-4 times a day or as needed. To make salt water, completely dissolve -1 tsp (3-6 g) of salt in 1 cup (237 mL) of warm water. Use nose drops made from salt water to ease congestion and soften raw skin around your nose. Take 2 tsp (10 mL) of honey at bedtime to lessen coughing at night. Do not give honey to children who are younger than 1 year. Drink enough fluid to keep your urine pale yellow. This helps prevent dehydration and helps loosen up mucus. General instructions  Rest as much as possible. Do not drink alcohol. Do not use any products that contain nicotine or tobacco. These products include cigarettes, chewing tobacco, and  vaping devices, such as e-cigarettes. If you need help quitting, ask your health care provider. Keep all follow-up visits. This is important. How is this prevented?     Get an annual flu shot. You may get the flu shot in late summer, fall, or winter. Ask your health care provider when you should get your flu shot. Avoid spreading your infection to other people. If you are sick: Wash your hands with soap and water often, especially after you cough or sneeze. Wash for at least 20 seconds. If soap and water are not available, use alcohol-based hand sanitizer. Cover your mouth when you cough. Cover your nose and mouth when you sneeze. Do not share cups or eating utensils. Clean commonly used objects often. Clean commonly touched surfaces. Stay home from work or school as told by your health care provider. Avoid contact with people who are sick during cold and flu season. This is generally fall and winter. Contact a health care provider if: Your symptoms last for 10 days or longer. Your symptoms get worse over time. You have severe sinus  pain in your face or forehead. The glands in your jaw or neck become very swollen. You have shortness of breath. Get help right away if you: Feel pain or pressure in your chest. Have trouble breathing. Faint or feel like you will faint. Have severe and persistent vomiting. Feel confused or disoriented. These symptoms may represent a serious problem that is an emergency. Do not wait to see if the symptoms will go away. Get medical help right away. Call your local emergency services (911 in the [place] Do not drive yourself to the hospital. Summary A respiratory infection is an illness that affects part of the respiratory system, such as the lungs, nose, or throat. A respiratory infection that is caused by a virus is called a viral respiratory infection. Common types of viral respiratory infections include a cold, influenza, and respiratory syncytial virus (RSV)  infection. Symptoms of this condition include a stuffy or runny nose, cough, fatigue, achy muscles, sore throat, and fevers or chills. Antibiotic medicines are not prescribed for viral infections. This is because antibiotics are designed to kill bacteria. They are not effective against viruses. This information is not intended to replace advice given to you by your health care provider. Make sure you discuss any questions you have with your health care provider. Document Revised: 12/21/2020 Document Reviewed: 12/21/2020 Elsevier Patient Education  2024 [employer]   Isolation Instructions: You are to isolate at home until you have been fever free for at least 24 hours without a fever-reducing medication, and symptoms have been steadily improving for 24 hours. At that time,  you can end isolation but need to mask for an additional 5 days.   If you must be around other household members who do not have symptoms, you need to make sure that both you and the family members are masking consistently with a high-quality mask.  If you note any worsening of symptoms despite treatment, please seek an in-person evaluation ASAP. If you note any significant shortness of breath or any chest pain, please seek ER evaluation. Please do not delay care!   COVID-19: What to Do if You Are Sick If you test positive and are an older adult or someone who is at high risk of getting very sick from COVID-19, treatment may be available. Contact a healthcare provider right away after a positive test to determine if you are eligible, even if your symptoms are mild right now. You can also visit a Test to Treat location and, if eligible, receive a prescription from a provider. Don't delay: Treatment must be started within the first few days to be effective. If you have a fever, cough, or other symptoms, you might have COVID-19. Most people have mild illness and are able to recover at home. If you are sick: Keep track of your  symptoms. If you have an emergency warning sign (including trouble breathing), call 911. Steps to help prevent the spread of COVID-19 if you are sick If you are sick with COVID-19 or think you might have COVID-19, follow the steps below to care for yourself and to help protect other people in your home and community. Stay home except to get medical care Stay home. Most people with COVID-19 have mild illness and can recover at home without medical care. Do not leave your home, except to get medical care. Do not visit public areas and do not go to places where you are unable to wear a mask. Take care of yourself. Get rest and stay hydrated.  Take over-the-counter medicines, such as acetaminophen , to help you feel better. Stay in touch with your doctor. Call before you get medical care. Be sure to get care if you have trouble breathing, or have any other emergency warning signs, or if you think it is an emergency. Avoid public transportation, ride-sharing, or taxis if possible. Get tested If you have symptoms of COVID-19, get tested. While waiting for test results, stay away from others, including staying apart from those living in your household. Get tested as soon as possible after your symptoms start. Treatments may be available for people with COVID-19 who are at risk for becoming very sick. Don't delay: Treatment must be started early to be effective--some treatments must begin within 5 days of your first symptoms. Contact your healthcare provider right away if your test result is positive to determine if you are eligible. Self-tests are one of several options for testing for the virus that causes COVID-19 and may be more convenient than laboratory-based tests and point-of-care tests. Ask your healthcare provider or your local health department if you need help interpreting your test results. You can visit your state, tribal, local, and territorial health department's website to look for the latest  local information on testing sites. Separate yourself from other people As much as possible, stay in a specific room and away from other people and pets in your home. If possible, you should use a separate bathroom. If you need to be around other people or animals in or outside of the home, wear a well-fitting mask. Tell your close contacts that they may have been exposed to COVID-19. An infected person can spread COVID-19 starting 48 hours (or 2 days) before the person has any symptoms or tests positive. By letting your close contacts know they may have been exposed to COVID-19, you are helping to protect everyone. See COVID-19 and Animals if you have questions about pets. If you are diagnosed with COVID-19, someone from the health department may call you. Answer the call to slow the spread. Monitor your symptoms Symptoms of COVID-19 include fever, cough, or other symptoms. Follow care instructions from your healthcare provider and local health department. Your local health authorities may give instructions on checking your symptoms and reporting information. When to seek emergency medical attention Look for emergency warning signs* for COVID-19. If someone is showing any of these signs, seek emergency medical care immediately: Trouble breathing Persistent pain or pressure in the chest New confusion Inability to wake or stay awake Pale, gray, or blue-colored skin, lips, or nail beds, depending on skin tone *This list is not all possible symptoms. Please call your medical provider for any other symptoms that are severe or concerning to you. Call 911 or call ahead to your local emergency facility: Notify the operator that you are seeking care for someone who has or may have COVID-19. Call ahead before visiting your doctor Call ahead. Many medical visits for routine care are being postponed or done by phone or telemedicine. If you have a medical appointment that cannot be postponed, call your  doctor's office, and tell them you have or may have COVID-19. This will help the office protect themselves and other patients. If you are sick, wear a well-fitting mask You should wear a mask if you must be around other people or animals, including pets (even at home). Wear a mask with the best fit, protection, and comfort for you. You don't need to wear the mask if you are alone. If you can't  put on a mask (because of trouble breathing, for example), cover your coughs and sneezes in some other way. Try to stay at least 6 feet away from other people. This will help protect the people around you. Masks should not be placed on young children under age 19 years, anyone who has trouble breathing, or anyone who is not able to remove the mask without help. Cover your coughs and sneezes Cover your mouth and nose with a tissue when you cough or sneeze. Throw away used tissues in a lined trash can. Immediately wash your hands with soap and water for at least 20 seconds. If soap and water are not available, clean your hands with an alcohol-based hand sanitizer that contains at least 60% alcohol. Clean your hands often Wash your hands often with soap and water for at least 20 seconds. This is especially important after blowing your nose, coughing, or sneezing; going to the bathroom; and before eating or preparing food. Use hand sanitizer if soap and water are not available. Use an alcohol-based hand sanitizer with at least 60% alcohol, covering all surfaces of your hands and rubbing them together until they feel dry. Soap and water are the best option, especially if hands are visibly dirty. Avoid touching your eyes, nose, and mouth with unwashed hands. Handwashing Tips Avoid sharing personal household items Do not share dishes, drinking glasses, cups, eating utensils, towels, or bedding with other people in your home. Wash these items thoroughly after using them with soap and water or put in the  dishwasher. Clean surfaces in your home regularly Clean and disinfect high-touch surfaces (for example, doorknobs, tables, handles, light switches, and countertops) in your sick room and bathroom. In shared spaces, you should clean and disinfect surfaces and items after each use by the person who is ill. If you are sick and cannot clean, a caregiver or other person should only clean and disinfect the area around you (such as your bedroom and bathroom) on an as needed basis. Your caregiver/other person should wait as long as possible (at least several hours) and wear a mask before entering, cleaning, and disinfecting shared spaces that you use. Clean and disinfect areas that may have blood, stool, or body fluids on them. Use household cleaners and disinfectants. Clean visible dirty surfaces with household cleaners containing soap or detergent. Then, use a household disinfectant. Use a product from [employer] List N: Disinfectants for Coronavirus (COVID-19). Be sure to follow the instructions on the label to ensure safe and effective use of the product. Many products recommend keeping the surface wet with a disinfectant for a certain period of time (look at contact time on the product label). You may also need to wear personal protective equipment, such as gloves, depending on the directions on the product label. Immediately after disinfecting, wash your hands with soap and water for 20 seconds. For completed guidance on cleaning and disinfecting your home, visit Complete Disinfection Guidance. Take steps to improve ventilation at home Improve ventilation (air flow) at home to help prevent from spreading COVID-19 to other people in your household. Clear out COVID-19 virus particles in the air by opening windows, using air filters, and turning on fans in your home. Use this interactive tool to learn how to improve air flow in your home. When you can be around others after being sick with  COVID-19 Deciding when you can be around others is different for different situations. Find out when you can safely end home isolation. For any additional  questions about your care, contact your healthcare provider or state or local health department. 12/19/2020 Content source: [facility] for Immunization and Respiratory Diseases ([facility]), Division of Viral Diseases This information is not intended to replace advice given to you by your health care provider. Make sure you discuss any questions you have with your health care provider. Document Revised: 02/01/2021 Document Reviewed: 02/01/2021 Elsevier Patient Education  2022 [employer].     If you have been instructed to have an in-person evaluation today at a local Urgent Care facility, please use the link below. It will take you to a list of all of our available [facility], including address, phone number and hours of operation. Please do not delay care.  [facility]  If you or a family member do not have a primary care provider, use the link below to schedule a visit and establish care. When you choose a [facility] primary care physician or advanced practice provider, you gain a long-term partner in health. Find a Primary Care Provider  Learn more about [facility] in-office and virtual care options: [place] - Get Care Now

## 2024-06-28 NOTE — Progress Notes (Signed)
 Virtual Visit Consent   Christy Maldonado, you are scheduled for a virtual visit with a [facility] provider today. Just as with appointments in the office, your consent must be obtained to participate. Your consent will be active for this visit and any virtual visit you may have with one of our providers in the next 365 days. If you have a MyChart account, a copy of this consent can be sent to you electronically.  As this is a virtual visit, video technology does not allow for your provider to perform a traditional examination. This may limit your provider's ability to fully assess your condition. If your provider identifies any concerns that need to be evaluated in person or the need to arrange testing (such as labs, EKG, etc.), we will make arrangements to do so. Although advances in technology are sophisticated, we cannot ensure that it will always work on either your end or our end. If the connection with a video visit is poor, the visit may have to be switched to a telephone visit. With either a video or telephone visit, we are not always able to ensure that we have a secure connection.  By engaging in this virtual visit, you consent to the provision of healthcare and authorize for your insurance to be billed (if applicable) for the services provided during this visit. Depending on your insurance coverage, you may receive a charge related to this service.  I need to obtain your verbal consent now. Are you willing to proceed with your visit today? Christy Maldonado has provided verbal consent on 06/28/2024 for a virtual visit (video or telephone). Delon CHRISTELLA Dickinson, PA-C  Date: 06/28/2024 12:15 PM   Virtual Visit via Video Note   I, Delon CHRISTELLA Dickinson, connected with  Christy Maldonado  ([id number], 1996-08-15) on 06/28/24 at 12:00 PM EDT by a video-enabled telemedicine application and verified that I am speaking with the correct person using two  identifiers.  Location: Patient: Virtual Visit Location Patient: Mobile Provider: Virtual Visit Location Provider: Home Office   I discussed the limitations of evaluation and management by telemedicine and the availability of in person appointments. The patient expressed understanding and agreed to proceed.    History of Present Illness: Christy Maldonado is a 28 y.o. who identifies as a female who was assigned female at birth, and is being seen today for URI symptoms.  HPI: URI  This is a new problem. The current episode started yesterday. The problem has been gradually worsening. There has been no fever (body feels hot, then chills-subjective). Associated symptoms include chest pain (burning with cough), congestion, coughing, ear pain (right ear), headaches (started right behind the right eye), rhinorrhea (and post nasal drainage), sneezing and a sore throat. Pertinent negatives include no diarrhea, nausea, plugged ear sensation, sinus pain, vomiting or wheezing. Associated symptoms comments: Body aches, hoarse voice. Treatments tried: dayquil, nyquil, cough drops. The treatment provided no relief.     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   Patellar dislocation 02/04/2022   Bilateral chronic knee pain 01/30/2022   Anxiety state 11/02/2020   Constipation 11/02/2020   Psychophysiological insomnia 11/02/2020   Dyspepsia 11/02/2020   Tachycardia 11/02/2020   Panic attacks 09/30/2016    Allergies: No Known Allergies Medications:  Current Outpatient Medications:    fluticasone (FLONASE) 50 MCG/ACT nasal spray, Place 2 sprays into both nostrils daily., Disp: 16 g, Rfl: 0   naproxen (  NAPROSYN) 500 MG tablet, Take 1 tablet (500 mg total) by mouth 2 (two) times daily with a meal., Disp: 30 tablet, Rfl: 0   promethazine -dextromethorphan (PROMETHAZINE -DM) 6.25-15 MG/5ML syrup, Take 5 mLs by mouth 4 (four) times  daily as needed., Disp: 118 mL, Rfl: 0   Clindamycin -Benzoyl Per, Refr, gel, Apply topically to clean skin twice daily, Disp: 45 g, Rfl: 0   erythromycin  ophthalmic ointment, Place 1 Application into the right eye 3 (three) times daily., Disp: 3.5 g, Rfl: 0   hydrOXYzine  (ATARAX ) 25 MG tablet, Take 1 tablet (25 mg total) by mouth daily as needed for anxiety., Disp: 30 tablet, Rfl: 1   ketoconazole  (NIZORAL ) 2 % cream, Apply 1 Application topically daily., Disp: 30 g, Rfl: 0   ketorolac  (TORADOL ) 10 MG tablet, Take 1 tablet (10 mg total) by mouth every 6 (six) hours as needed., Disp: 20 tablet, Rfl: 0   mirtazapine  (REMERON ) 15 MG tablet, Take 1 tablet (15 mg total) by mouth at bedtime., Disp: 30 tablet, Rfl: 2   Multiple Vitamin (MULTIVITAMIN WITH MINERALS) TABS tablet, Take 1 tablet by mouth daily., Disp: , Rfl:    omeprazole  (PRILOSEC) 20 MG capsule, Take 1 capsule (20 mg total) by mouth daily., Disp: 90 capsule, Rfl: 0  Observations/Objective: Patient is well-developed, well-nourished in no acute distress.  Resting comfortably  Head is normocephalic, atraumatic.  No labored breathing.  Speech is clear and coherent with logical content.  Patient is alert and oriented at baseline.    Assessment and Plan: 1. Viral URI with cough (Primary) - promethazine -dextromethorphan (PROMETHAZINE -DM) 6.25-15 MG/5ML syrup; Take 5 mLs by mouth 4 (four) times daily as needed.  Dispense: 118 mL; Refill: 0 - fluticasone (FLONASE) 50 MCG/ACT nasal spray; Place 2 sprays into both nostrils daily.  Dispense: 16 g; Refill: 0 - naproxen (NAPROSYN) 500 MG tablet; Take 1 tablet (500 mg total) by mouth 2 (two) times daily with a meal.  Dispense: 30 tablet; Refill: 0  - Suspect viral URI - Symptomatic medications of choice over the counter as needed - Add Promethazine  DM for cough - Flonase for nasal congestion and drainage - Naprosyn for body aches and fevers - Push fluids - Rest - Seek further evaluation if  symptoms change or worsen   Follow Up Instructions: I discussed the assessment and treatment plan with the patient. The patient was provided an opportunity to ask questions and all were answered. The patient agreed with the plan and demonstrated an understanding of the instructions.  A copy of instructions were sent to the patient via MyChart unless otherwise noted below.    The patient was advised to call back or seek an in-person evaluation if the symptoms worsen or if the condition fails to improve as anticipated.    Delon CHRISTELLA Dickinson, PA-C

## 2024-06-28 NOTE — Addendum Note (Signed)
 Addended by: VIVIENNE DELON HERO on: 06/28/2024 02:30 PM   Modules accepted: Orders

## 2024-09-20 ENCOUNTER — Encounter (HOSPITAL_COMMUNITY): Payer: Self-pay

## 2024-09-20 ENCOUNTER — Telehealth (HOSPITAL_COMMUNITY): Admitting: Psychiatry

## 2024-09-22 ENCOUNTER — Telehealth (HOSPITAL_COMMUNITY): Admitting: Psychiatry

## 2024-09-22 ENCOUNTER — Encounter (HOSPITAL_COMMUNITY): Payer: Self-pay | Admitting: Psychiatry

## 2024-09-22 DIAGNOSIS — F321 Major depressive disorder, single episode, moderate: Secondary | ICD-10-CM

## 2024-09-22 DIAGNOSIS — F4321 Adjustment disorder with depressed mood: Secondary | ICD-10-CM

## 2024-09-22 DIAGNOSIS — F411 Generalized anxiety disorder: Secondary | ICD-10-CM

## 2024-09-22 MED ORDER — MIRTAZAPINE 15 MG PO TABS: 15.0000 mg | ORAL_TABLET | Freq: Every day | ORAL | 2 refills | Status: AC

## 2024-09-22 NOTE — Progress Notes (Signed)
 " [facility] Follow up visit   Patient Identification: Christy Maldonado MRN:  [id number] Date of Evaluation:  09/22/2024 Referral Source: primary care Chief Complaint:   No chief complaint on file. Follow up grief, depression  Visit Diagnosis:  MDD, GAD, Grief Virtual Visit via Video Note  I connected with Christy Maldonado on 09/22/2024 at 10:00 AM EST by a video enabled telemedicine application and verified that I am speaking with the correct person using two identifiers.  Location: Patient: parked car Provider: home office   I discussed the limitations of evaluation and management by telemedicine and the availability of in person appointments. The patient expressed understanding and agreed to proceed.       I discussed the assessment and treatment plan with the patient. The patient was provided an opportunity to ask questions and all were answered. The patient agreed with the plan and demonstrated an understanding of the instructions.   The patient was advised to call back or seek an in-person evaluation if the symptoms worsen or if the condition fails to improve as anticipated.  I provided 15 minutes of non-face-to-face time during this encounter.     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  Patient born premature  Modifying factors; dogs, walk Duration  more then 3 years   Severity  manageable, improved  Hospital admission with suicide attempt denies    Past Psychiatric History: anxiety  Previous Psychotropic Medications: Yes  Zoloft  and lexapro Substance  Abuse History in the last 12 months:  No.  Consequences of Substance Abuse: NA  Past Medical History:  Past Medical History:  Diagnosis Date   Anxiety    [employer] 11/28/2020   Depression    [employer] 11/28/2020    Past Surgical History:  Procedure Laterality Date   ARTHROSCOPIC REPAIR ACL  26-Oct-2015   EYE SURGERY N/A    [employer] 11/28/2020    Family Psychiatric History: denies. Dad had disability due to strokes  Family History:  Family History  Problem Relation Age of Onset   Hypertension Mother    Diabetes Mother    Hypertension Father     Social History:   Social History   Socioeconomic History   Marital status: Single    Spouse name: Not on file   Number of children: Not on file   Years of education: Not on file   Highest education level: Not on file  Occupational History   Not on file  Tobacco Use   Smoking status: Former    Current packs/day: 0.00    Types: Cigarettes    Quit date: Oct 25, 2017    Years since quitting: 7.9   Smokeless tobacco: Never  Vaping Use   Vaping status: Every Day  Substance and Sexual Activity   Alcohol use: Not Currently    Comment: occasionally   Drug use: Not Currently    Types: Marijuana   Sexual activity: Yes  Other Topics Concern   Not on file  Social History Narrative   Not on file   Social Drivers of Health   Tobacco Use: Medium Risk (06/28/2024)   Patient  History    Smoking Tobacco Use: Former    Smokeless Tobacco Use: Never    Passive Exposure: Not on [facility] Strain: Not on file  Food Insecurity: Not on file  Transportation Needs: Not on file  Physical Activity: Not on file  Stress: Not on file  Social Connections: Not on file  Depression (PHQ2-9): Low Risk (01/30/2022)   Depression (PHQ2-9)    PHQ-2 Score: 0  Recent Concern: Depression (PHQ2-9) - Medium Risk (01/23/2022)   Depression (PHQ2-9)    PHQ-2 Score: 10  Alcohol Screen: Not on file  Housing: Not on file  Utilities: Not on file  Health  Literacy: Not on file      Allergies:  No Known Allergies  Metabolic Disorder Labs: Lab Results  Component Value Date   HGBA1C 5.3 01/03/2021   No results found for: PROLACTIN Lab Results  Component Value Date   CHOL 219 (H) 01/03/2021   TRIG 95 01/03/2021   HDL 87 01/03/2021   CHOLHDL 2.5 01/03/2021   LDLCALC 116 (H) 01/03/2021   Lab Results  Component Value Date   TSH 0.724 10/31/2020    Therapeutic Level Labs: No results found for: LITHIUM No results found for: CBMZ No results found for: VALPROATE  Current Medications: Current Outpatient Medications  Medication Sig Dispense Refill   Clindamycin -Benzoyl Per, Refr, gel Apply topically to clean skin twice daily 45 g 0   erythromycin  ophthalmic ointment Place 1 Application into the right eye 3 (three) times daily. 3.5 g 0   fluticasone  (FLONASE ) 50 MCG/ACT nasal spray Place 2 sprays into both nostrils daily. 16 g 0   hydrOXYzine  (ATARAX ) 25 MG tablet Take 1 tablet (25 mg total) by mouth daily as needed for anxiety. 30 tablet 1   ketoconazole  (NIZORAL ) 2 % cream Apply 1 Application topically daily. 30 g 0   ketorolac  (TORADOL ) 10 MG tablet Take 1 tablet (10 mg total) by mouth every 6 (six) hours as needed. 20 tablet 0   mirtazapine  (REMERON ) 15 MG tablet Take 1 tablet (15 mg total) by mouth at bedtime. 30 tablet 2   Multiple Vitamin (MULTIVITAMIN WITH MINERALS) TABS tablet Take 1 tablet by mouth daily.     naproxen  (NAPROSYN ) 500 MG tablet Take 1 tablet (500 mg total) by mouth 2 (two) times daily with a meal. 30 tablet 0   omeprazole  (PRILOSEC) 20 MG capsule Take 1 capsule (20 mg total) by mouth daily. 90 capsule 0   promethazine -dextromethorphan (PROMETHAZINE -DM) 6.25-15 MG/5ML syrup Take 5 mLs by mouth 4 (four) times daily as needed. 118 mL 0   No current facility-administered medications for this visit.     Psychiatric Specialty Exam: Review of Systems  Cardiovascular:  Negative for chest pain.   Neurological:  Negative for tremors.  Psychiatric/Behavioral:  Negative for agitation, hallucinations and self-injury.     There were no vitals taken for this visit.There is no height or weight on file to calculate BMI.  General Appearance: Casual  Eye Contact:  Fair  Speech:  Slow  Volume:  Decreased  Mood:  fair  Affect:  Constricted  Thought Process:  Goal Directed  Orientation:  Full (Time, Place, and Person)  Thought Content:  Rumination  Suicidal Thoughts:  No  Homicidal Thoughts:  No  Memory:  Immediate;   Fair  Judgement:  Fair  Insight:  Fair  Psychomotor Activity:  Decreased  Concentration:  Concentration: Fair  Recall:  Dotti Abe of Knowledge:Fair  Language: Fair  Akathisia:  No  Handed:    AIMS (if indicated):  not done  Assets:  Desire for Improvement Financial Resources/Insurance Social Support  ADL's:  Intact  Cognition: WNL  Sleep:  Fair   Screenings: GAD-7    Flowsheet Row Office Visit from 01/30/2022 in [facility] at [facility] Office Visit from 10/31/2020 in [facility] at [facility]  Total GAD-7 Score 1 10   PHQ2-9    Flowsheet Row Office Visit from 01/30/2022 in [facility] at [facility] Office Visit from 01/23/2022 in [facility] at [facility] Office Visit from 06/27/2021 in [facility] at [facility] Telemedicine from 04/06/2021 in [facility] at [facility] Office Visit from 01/03/2021 in [facility] at [facility]  PHQ-2 Total Score 0 2 0 2 0  PHQ-9 Total Score -- 10 -- 6 0   Flowsheet Row Video Visit from 06/23/2024 in [facility] at [facility] ED from 03/10/2024 in [facility] Department at [facility] Video Visit from 11/17/2023 in [facility] at [facility]  C-SSRS RISK CATEGORY No Risk No Risk No Risk    Assessment  and Plan: as follows Prior documentation reviewed  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

## 2025-01-21 ENCOUNTER — Telehealth (HOSPITAL_COMMUNITY): Admitting: Psychiatry
